# Patient Record
Sex: Female | Born: 1940 | Race: Black or African American | Hispanic: No | Marital: Married | Smoking: Never smoker
Health system: Southern US, Community
[De-identification: ages and names within clinical notes are randomized; demographics above are authoritative.]

## PROBLEM LIST (undated history)

## (undated) DIAGNOSIS — G620 Drug-induced polyneuropathy: Secondary | ICD-10-CM

## (undated) DIAGNOSIS — I509 Heart failure, unspecified: Secondary | ICD-10-CM

## (undated) DIAGNOSIS — I5022 Chronic systolic (congestive) heart failure: Secondary | ICD-10-CM

## (undated) DIAGNOSIS — C541 Malignant neoplasm of endometrium: Secondary | ICD-10-CM

## (undated) DIAGNOSIS — R627 Adult failure to thrive: Secondary | ICD-10-CM

## (undated) DIAGNOSIS — I10 Essential (primary) hypertension: Secondary | ICD-10-CM

## (undated) DIAGNOSIS — D649 Anemia, unspecified: Secondary | ICD-10-CM

## (undated) DIAGNOSIS — E119 Type 2 diabetes mellitus without complications: Secondary | ICD-10-CM

## (undated) HISTORY — PX: CHOLECYSTECTOMY OPEN: SUR202

## (undated) HISTORY — PX: MASTECTOMY: SHX3

---

## 2017-07-27 ENCOUNTER — Other Ambulatory Visit: Payer: Self-pay | Admitting: Surgery

## 2017-07-27 ENCOUNTER — Ambulatory Visit
Admission: RE | Admit: 2017-07-27 | Discharge: 2017-07-27 | Disposition: A | Discharge status: Home / Self Care | Payer: Medicare Other | Source: Ambulatory Visit | Attending: Surgery | Admitting: Surgery

## 2017-07-27 ENCOUNTER — Encounter: Payer: Medicare Other | Attending: Surgery | Admitting: Surgery

## 2017-07-27 DIAGNOSIS — Z7984 Long term (current) use of oral hypoglycemic drugs: Secondary | ICD-10-CM | POA: Diagnosis not present

## 2017-07-27 DIAGNOSIS — M8668 Other chronic osteomyelitis, other site: Secondary | ICD-10-CM | POA: Insufficient documentation

## 2017-07-27 DIAGNOSIS — E11622 Type 2 diabetes mellitus with other skin ulcer: Secondary | ICD-10-CM | POA: Insufficient documentation

## 2017-07-27 DIAGNOSIS — Z9289 Personal history of other medical treatment: Secondary | ICD-10-CM | POA: Diagnosis present

## 2017-07-27 DIAGNOSIS — L599 Disorder of the skin and subcutaneous tissue related to radiation, unspecified: Secondary | ICD-10-CM | POA: Insufficient documentation

## 2017-07-27 DIAGNOSIS — N3040 Irradiation cystitis without hematuria: Secondary | ICD-10-CM | POA: Diagnosis not present

## 2017-07-27 DIAGNOSIS — I509 Heart failure, unspecified: Secondary | ICD-10-CM | POA: Insufficient documentation

## 2017-07-27 DIAGNOSIS — J9811 Atelectasis: Secondary | ICD-10-CM | POA: Insufficient documentation

## 2017-07-27 DIAGNOSIS — Z9221 Personal history of antineoplastic chemotherapy: Secondary | ICD-10-CM | POA: Diagnosis not present

## 2017-07-27 DIAGNOSIS — K520 Gastroenteritis and colitis due to radiation: Secondary | ICD-10-CM | POA: Diagnosis not present

## 2017-07-27 DIAGNOSIS — Z86711 Personal history of pulmonary embolism: Secondary | ICD-10-CM | POA: Diagnosis not present

## 2017-07-27 DIAGNOSIS — J90 Pleural effusion, not elsewhere classified: Secondary | ICD-10-CM | POA: Insufficient documentation

## 2017-07-27 DIAGNOSIS — E1140 Type 2 diabetes mellitus with diabetic neuropathy, unspecified: Secondary | ICD-10-CM | POA: Insufficient documentation

## 2017-07-27 DIAGNOSIS — Z923 Personal history of irradiation: Secondary | ICD-10-CM | POA: Diagnosis not present

## 2017-07-27 DIAGNOSIS — I110 Hypertensive heart disease with heart failure: Secondary | ICD-10-CM | POA: Diagnosis not present

## 2017-07-27 DIAGNOSIS — C541 Malignant neoplasm of endometrium: Secondary | ICD-10-CM | POA: Diagnosis not present

## 2017-07-27 DIAGNOSIS — Z79899 Other long term (current) drug therapy: Secondary | ICD-10-CM | POA: Diagnosis not present

## 2017-07-30 NOTE — Progress Notes (Signed)
[name], [name] ([id number]) Visit Report for 07/27/2017 Allergy List Details Patient Name: Shari Turner, Shari Turner Date of Service: 07/27/2017 8:00 AM Medical Record Number: [id number] Patient Account Number: [id number] Date of Birth/Sex: 1941-03-10 (76 y.o. Female) Treating RN: Montey Hora Primary Care Arianne Klinge: Gershon Crane Other Clinician: Referring Juri Dinning: Gershon Crane Treating Inocencio Roy/Extender: Frann Rider in Treatment: 0 Allergies Active Allergies No Known Allergies Allergy Notes Electronic Signature(s) Signed: 07/27/2017 4:04:29 PM By: Montey Hora Entered By: Montey Hora on 07/27/2017 08:37:17 Shari Turner ([id number]) -------------------------------------------------------------------------------- Arrival Information Details Patient Name: Shari Turner Date of Service: 07/27/2017 8:00 AM Medical Record Number: [id number] Patient Account Number: [id number] Date of Birth/Sex: [birth year] (76 y.o. Female) Treating RN: Montey Hora Primary Care Harvel Meskill: Gershon Crane Other Clinician: Referring Maykel Reitter: Gershon Crane Treating Mariluz Crespo/Extender: Frann Rider in Treatment: 0 Visit Information Patient Arrived: Ambulatory Arrival Time: 08:32 Accompanied By: dtr Transfer Assistance: None Patient Identification Verified: Yes Secondary Verification Process Yes Completed: Patient Has Alerts: Yes Patient Alerts: Patient on Blood Thinner DMII Eliquis Electronic Signature(s) Signed: 07/27/2017 4:04:29 PM By: Montey Hora Entered By: Montey Hora on 07/27/2017 08:33:51 Shari Turner ([id number]) -------------------------------------------------------------------------------- Clinic Level of Care Assessment Details Patient Name: Shari Turner Date of Service: 07/27/2017 8:00 AM Medical Record Number: [id number] Patient Account Number: [id number] Date of Birth/Sex: 26-Mar-1941 (76 y.o. Female) Treating RN: Montey Hora Primary Care  Brittnee Gaetano: Gershon Crane Other Clinician: Referring Jupiter Boys: Gershon Crane Treating Recardo Linn/Extender: Frann Rider in Treatment: 0 Clinic Level of Care Assessment Items TOOL 2 Quantity Score []  - Use when only an EandM is performed on the INITIAL visit 0 ASSESSMENTS - Nursing Assessment / Reassessment X - General Physical Exam (combine w/ comprehensive assessment (listed just below) when 1 20 performed on new pt. evals) X- 1 25 Comprehensive Assessment (HX, ROS, Risk Assessments, Wounds Hx, etc.) ASSESSMENTS - Wound and Skin Assessment / Reassessment []  - Simple Wound Assessment / Reassessment - one wound 0 []  - 0 Complex Wound Assessment / Reassessment - multiple wounds []  - 0 Dermatologic / Skin Assessment (not related to wound area) ASSESSMENTS - Ostomy and/or Continence Assessment and Care []  - Incontinence Assessment and Management 0 []  - 0 Ostomy Care Assessment and Management (repouching, etc.) PROCESS - Coordination of Care X - Simple Patient / Family Education for ongoing care 1 15 []  - 0 Complex (extensive) Patient / Family Education for ongoing care X- 1 10 Staff obtains Programmer, systems, Records, Test Results / Process Orders []  - 0 Staff telephones HHA, Nursing Homes / Clarify orders / etc []  - 0 Routine Transfer to another Facility (non-emergent condition) []  - 0 Routine Hospital Admission (non-emergent condition) X- 1 15 New Admissions / Biomedical engineer / Ordering NPWT, Apligraf, etc. []  - 0 Emergency Hospital Admission (emergent condition) X- 1 10 Simple Discharge Coordination []  - 0 Complex (extensive) Discharge Coordination PROCESS - Special Needs []  - Pediatric / Minor Patient Management 0 []  - 0 Isolation Patient Management [name], [name] ([id number]) []  - 0 Hearing / Language / Visual special needs []  - 0 Assessment of Community assistance (transportation, D/C planning, etc.) []  - 0 Additional assistance / Altered mentation []   - 0 Support Surface(s) Assessment (bed, cushion, seat, etc.) INTERVENTIONS - Wound Cleansing / Measurement []  - Wound Imaging (photographs - any number of wounds) 0 []  - 0 Wound Tracing (instead of photographs) []  - 0 Simple Wound Measurement - one wound []  - 0 Complex Wound Measurement - multiple wounds []  - 0 Simple Wound Cleansing - one wound []  - 0 Complex Wound  understanding of the [facility] Date Initiated: 07/27/2017 Target Resolution Date: 08/17/2017 Goal Status: Active Interventions: Provide education on orientation to the wound center Notes: Electronic Signature(s) Signed: 07/27/2017 1:13:13 PM By: Montey Hora Entered By: Montey Hora on 07/27/2017 13:13:12 Shari Turner ([id number]) -------------------------------------------------------------------------------- Pain Assessment Details Patient Name: Shari Turner Date of Service: 07/27/2017 8:00 AM Medical Record Number: [id number] Patient Account Number: [id number] Date of Birth/Sex: 02-03-1941 (76 y.o. Female) Treating RN: Montey Hora Primary Care Latondra Gebhart: Gershon Crane Other Clinician: Referring Amour Trigg: Gershon Crane Treating Saud Bail/Extender: Frann Rider in Treatment: 0 Active Problems Location of Pain  Severity and Description of Pain Patient Has Paino No Site Locations Pain Management and Medication Current Pain Management: Notes Topical or injectable lidocaine is offered to patient for acute pain when surgical debridement is performed. If needed, Patient is instructed to use over the counter pain medication for the following 24-48 hours after debridement. Wound care MDs do not prescribed pain medications. Patient has chronic pain or uncontrolled pain. Patient has been instructed to make an appointment with their Primary Care Physician for pain management. Electronic Signature(s) Signed: 07/27/2017 4:04:29 PM By: Montey Hora Entered By: Montey Hora on 07/27/2017 08:34:04 Shari Turner ([id number]) -------------------------------------------------------------------------------- Patient/Caregiver Education Details Patient Name: Shari Turner Date of Service: 07/27/2017 8:00 AM Medical Record Number: [id number] Patient Account Number: [id number] Date of Birth/Gender: Mar 23, 1941 (76 y.o. Female) Treating RN: Montey Hora Primary Care Physician: Gershon Crane Other Clinician: Referring Physician: Gershon Crane Treating Physician/Extender: Frann Rider in Treatment: 0 Education Assessment Education Provided To: Patient and Caregiver Education Topics Provided Basic Hygiene: Handouts: Other: cannot wear lotion or hairspray in chambers Methods: Explain/Verbal Responses: State content correctly Electronic Signature(s) Signed: 07/27/2017 4:04:29 PM By: Montey Hora Entered By: Montey Hora on 07/27/2017 13:16:13 Shari Turner ([id number]) -------------------------------------------------------------------------------- [place] Details Patient Name: Shari Turner Date of Service: 07/27/2017 8:00 AM Medical Record Number: [id number] Patient Account Number: [id number] Date of Birth/Sex: Nov 15, 1940 (76 y.o. Female) Treating RN: Montey Hora Primary Care  Ethne Jeon: Gershon Crane Other Clinician: Referring Dub Maclellan: Gershon Crane Treating Aerilyn Slee/Extender: Frann Rider in Treatment: 0 Vital Signs Time Taken: 08:34 Temperature (F): 98.5 Height (in): 62 Pulse (bpm): 81 Source: Measured Respiratory Rate (breaths/min): 16 Weight (lbs): 148 Blood Pressure (mmHg): 142/63 Source: Measured Reference Range: 80 - 120 mg / dl Body Mass Index (BMI): 27.1 Electronic Signature(s) Signed: 07/27/2017 4:04:29 PM By: Montey Hora Entered By: Montey Hora on 07/27/2017 08:36:51  understanding of the [facility] Date Initiated: 07/27/2017 Target Resolution Date: 08/17/2017 Goal Status: Active Interventions: Provide education on orientation to the wound center Notes: Electronic Signature(s) Signed: 07/27/2017 1:13:13 PM By: Montey Hora Entered By: Montey Hora on 07/27/2017 13:13:12 Shari Turner ([id number]) -------------------------------------------------------------------------------- Pain Assessment Details Patient Name: Shari Turner Date of Service: 07/27/2017 8:00 AM Medical Record Number: [id number] Patient Account Number: [id number] Date of Birth/Sex: 02-03-1941 (76 y.o. Female) Treating RN: Montey Hora Primary Care Latondra Gebhart: Gershon Crane Other Clinician: Referring Amour Trigg: Gershon Crane Treating Saud Bail/Extender: Frann Rider in Treatment: 0 Active Problems Location of Pain  Severity and Description of Pain Patient Has Paino No Site Locations Pain Management and Medication Current Pain Management: Notes Topical or injectable lidocaine is offered to patient for acute pain when surgical debridement is performed. If needed, Patient is instructed to use over the counter pain medication for the following 24-48 hours after debridement. Wound care MDs do not prescribed pain medications. Patient has chronic pain or uncontrolled pain. Patient has been instructed to make an appointment with their Primary Care Physician for pain management. Electronic Signature(s) Signed: 07/27/2017 4:04:29 PM By: Montey Hora Entered By: Montey Hora on 07/27/2017 08:34:04 Shari Turner ([id number]) -------------------------------------------------------------------------------- Patient/Caregiver Education Details Patient Name: Shari Turner Date of Service: 07/27/2017 8:00 AM Medical Record Number: [id number] Patient Account Number: [id number] Date of Birth/Gender: Mar 23, 1941 (76 y.o. Female) Treating RN: Montey Hora Primary Care Physician: Gershon Crane Other Clinician: Referring Physician: Gershon Crane Treating Physician/Extender: Frann Rider in Treatment: 0 Education Assessment Education Provided To: Patient and Caregiver Education Topics Provided Basic Hygiene: Handouts: Other: cannot wear lotion or hairspray in chambers Methods: Explain/Verbal Responses: State content correctly Electronic Signature(s) Signed: 07/27/2017 4:04:29 PM By: Montey Hora Entered By: Montey Hora on 07/27/2017 13:16:13 Shari Turner ([id number]) -------------------------------------------------------------------------------- [place] Details Patient Name: Shari Turner Date of Service: 07/27/2017 8:00 AM Medical Record Number: [id number] Patient Account Number: [id number] Date of Birth/Sex: Nov 15, 1940 (76 y.o. Female) Treating RN: Montey Hora Primary Care  Ethne Jeon: Gershon Crane Other Clinician: Referring Dub Maclellan: Gershon Crane Treating Aerilyn Slee/Extender: Frann Rider in Treatment: 0 Vital Signs Time Taken: 08:34 Temperature (F): 98.5 Height (in): 62 Pulse (bpm): 81 Source: Measured Respiratory Rate (breaths/min): 16 Weight (lbs): 148 Blood Pressure (mmHg): 142/63 Source: Measured Reference Range: 80 - 120 mg / dl Body Mass Index (BMI): 27.1 Electronic Signature(s) Signed: 07/27/2017 4:04:29 PM By: Montey Hora Entered By: Montey Hora on 07/27/2017 08:36:51

## 2017-07-30 NOTE — Progress Notes (Addendum)
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 Turner Date of Service: 07/27/2017 Medical Record Number: [id number] Patient Account Number: [id number] Date of Birth/Sex: 01-Jan-1941 (76 y.o. 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, unspecified Facility Procedures CPT4 Code: [id number] Description: 99213 - WOUND CARE VISIT-LEV 3 EST PT Modifier: Quantity: 1 Physician Procedures CPT4 Code Description: [id number] 14431 - WC PHYS LEVEL 4 -  NEW PT ICD-10 Diagnosis Description 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

## 2017-07-30 NOTE — Progress Notes (Signed)
Shari Turner, Shari Turner ([id number]) Visit Report for 07/27/2017 Abuse/Suicide Risk Screen Details Patient Name: Shari Turner Date of Service: 07/27/2017 8:00 AM Medical Record Number: [id number] Patient Account Number: [id number] Date of Birth/Sex: 17-Jul-1941 (76 y.o. Female) Treating RN: Curtis Sites Primary Care Rozena Fierro: [name] Other Clinician: Referring Jensen Kilburg: [name] Treating Rexine Gowens/Extender: Rudene Re in Treatment: 0 Abuse/Suicide Risk Screen Items Answer ABUSE/SUICIDE RISK SCREEN: Has anyone close to you tried to hurt or harm you recentlyo No Do you feel uncomfortable with anyone in your familyo No Has anyone forced you do things that you didnot want to doo No Do you have any thoughts of harming yourselfo No Patient displays signs or symptoms of abuse and/or neglect. No Electronic Signature(s) Signed: 07/27/2017 4:04:29 PM By: Curtis Sites Entered By: Curtis Sites on 07/27/2017 08:37:26 Shari Turner ([id number]) -------------------------------------------------------------------------------- Activities of Daily Living Details Patient Name: Shari Turner Date of Service: 07/27/2017 8:00 AM Medical Record Number: [id number] Patient Account Number: [id number] Date of Birth/Sex: May 01, 1941 (76 y.o. Female) Treating RN: Curtis Sites Primary Care Javell Blackburn: [name] Other Clinician: Referring Trejon Duford: [name] Treating Jacquelinne Speak/Extender: Rudene Re in Treatment: 0 Activities of Daily Living Items Answer Activities of Daily Living (Please select one for each item) Drive Automobile Completely Able Take Medications Completely Able Use Telephone Completely Able Care for Appearance Completely Able Use Toilet Completely Able Bath / Shower Completely Able Dress Self Completely Able Feed Self Completely Able Walk Completely Able Get In / Out Bed Completely Able Housework Completely Able Prepare Meals Completely  Able Handle Money Completely Able Shop for Self Completely Able Electronic Signature(s) Signed: 07/27/2017 4:04:29 PM By: Curtis Sites Entered By: Curtis Sites on 07/27/2017 08:37:45 Shari Turner ([id number]) -------------------------------------------------------------------------------- Education Assessment Details Patient Name: Shari Turner Date of Service: 07/27/2017 8:00 AM Medical Record Number: [id number] Patient Account Number: [id number] Date of Birth/Sex: Jul 11, 1941 (76 y.o. Female) Treating RN: Curtis Sites Primary Care Alyne Martinson: [name] Other Clinician: Referring Carlia Bomkamp: [name] Treating Enolia Koepke/Extender: Rudene Re in Treatment: 0 Primary Learner Assessed: Patient Learning Preferences/Education Level/Primary Language Learning Preference: Explanation, Demonstration Highest Education Level: High School Cognitive Barrier Assessment/Beliefs Language Barrier: No Translator Needed: No Memory Deficit: No Emotional Barrier: No Cultural/Religious Beliefs Affecting Medical Care: No Physical Barrier Assessment Impaired Vision: No Impaired Hearing: No Decreased Hand dexterity: No Knowledge/Comprehension Assessment Knowledge Level: Medium Comprehension Level: Medium Ability to understand written Medium instructions: Ability to understand verbal Medium instructions: Motivation Assessment Anxiety Level: Calm Cooperation: Cooperative Education Importance: Acknowledges Need Interest in Health Problems: Asks Questions Perception: Coherent Willingness to Engage in Self- Medium Management Activities: Readiness to Engage in Self- Medium Management Activities: Electronic Signature(s) Signed: 07/27/2017 4:04:29 PM By: Curtis Sites Entered By: Curtis Sites on 07/27/2017 08:38:06 Shari Turner ([id number]) -------------------------------------------------------------------------------- Fall Risk Assessment Details Patient  Name: Shari Turner Date of Service: 07/27/2017 8:00 AM Medical Record Number: [id number] Patient Account Number: [id number] Date of Birth/Sex: [birth year] (76 y.o. Female) Treating RN: Curtis Sites Primary Care Mylan Lengyel: [name] Other Clinician: Referring Alek Poncedeleon: [name] Treating Ruhee Enck/Extender: Rudene Re in Treatment: 0 Fall Risk Assessment Items Have you had 2 or more falls in the last 12 monthso 0 No Have you had any fall that resulted in injury in the last 12 monthso 0 No FALL RISK ASSESSMENT: History of falling - immediate or within 3 months 0 No Secondary diagnosis 0 No Ambulatory aid None/bed rest/wheelchair/nurse 0 Yes Crutches/cane/walker 0 No Furniture 0 No IV Access/Saline Lock 0 No Gait/Training Normal/bed rest/immobile 0 No Weak 10 Yes Impaired  0 No Mental Status Oriented to own ability 0 Yes Electronic Signature(s) Signed: 07/27/2017 4:04:29 PM By: Curtis Sites Entered By: Curtis Sites on 07/27/2017 08:38:26 Shari Turner ([id number]) -------------------------------------------------------------------------------- Nutrition Risk Assessment Details Patient Name: Shari Turner Date of Service: 07/27/2017 8:00 AM Medical Record Number: [id number] Patient Account Number: [id number] Date of Birth/Sex: 05-01-1941 (76 y.o. Female) Treating RN: Curtis Sites Primary Care Dannie Hattabaugh: [name] Other Clinician: Referring Lelia Jons: [name] Treating Shelitha Magley/Extender: Rudene Re in Treatment: 0 Height (in): 62 Weight (lbs): 148 Body Mass Index (BMI): 27.1 Nutrition Risk Assessment Items NUTRITION RISK SCREEN: I have an illness or condition that made me change the kind and/or amount of 0 No food I eat I eat fewer than two meals per day 0 No I eat few fruits and vegetables, or milk products 0 No I have three or more drinks of beer, liquor or wine almost every day 0 No I have tooth or mouth problems  that make it hard for me to eat 0 No I don't always have enough money to buy the food I need 0 No I eat alone most of the time 0 No I take three or more different prescribed or over-the-counter drugs a day 1 Yes Without wanting to, I have lost or gained 10 pounds in the last six months 0 No I am not always physically able to shop, cook and/or feed myself 0 No Nutrition Protocols Good Risk Protocol 0 No interventions needed Moderate Risk Protocol Electronic Signature(s) Signed: 07/27/2017 4:04:29 PM By: Curtis Sites Entered By: Curtis Sites on 07/27/2017 08:38:32

## 2017-08-06 ENCOUNTER — Encounter: Payer: Medicare Other | Admitting: Surgery

## 2017-08-06 DIAGNOSIS — E11622 Type 2 diabetes mellitus with other skin ulcer: Secondary | ICD-10-CM | POA: Diagnosis not present

## 2017-08-06 NOTE — Progress Notes (Addendum)
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, other site L59.9  Disorder of the skin and subcutaneous tissue related to radiation, unspecified Facility Procedures CPT4 Code: [id number] Description: [phone] - WOUND CARE VISIT-LEV 2 EST PT Modifier: Quantity: 1 Physician Procedures CPT4 Code: [id number] Description: [phone] - WC PHYS  LEVEL 2 - EST PT ICD-10 Diagnosis Description 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 M86.68 Other chronic  osteomyelitis, other site Modifier: Quantity: 1 Electronic Signature(s) Signed: 08/24/2017 10:18:15 AM By: Christin Fudge MD, FACS Previous Signature: 08/24/2017 8:13:40 AM Version By: Christin Fudge MD, FACS Previous Signature: 08/22/2017 1:09:44 PM Version By: Christin Fudge MD, FACS Previous Signature: 08/16/2017 10:55:35 AM Version By: Christin Fudge MD, FACS Previous Signature: 08/06/2017 12:36:11 PM Version By: Montey Hora Previous Signature: 08/06/2017 4:25:36 PM Version By: Christin Fudge MD, FACS Previous Signature: 08/06/2017 10:26:44 AM Version By: Christin Fudge MD, FACS Entered By: Christin Fudge on 08/24/2017 10:18:15

## 2017-08-07 NOTE — Progress Notes (Signed)
wound []  - 0 Complex Wound Measurement - multiple wounds INTERVENTIONS - Wound Dressings []  - Small Wound Dressing one or multiple wounds 0 []  - 0 Medium Wound Dressing one or multiple wounds []  - 0 Large Wound Dressing one or multiple wounds []  - 0 Application of Medications - topical []  - 0 Application of Medications - injection INTERVENTIONS - Miscellaneous []  - External ear exam 0 []  - 0 Specimen Collection (cultures, biopsies, blood, body fluids, etc.) []  - 0 Specimen(s) / Culture(s) sent or taken to Lab for analysis []  - 0 Patient Transfer (multiple staff / Civil Service fast streamer / Similar devices) []  - 0 Simple Staple / Suture removal (25 or less) []  - 0 Complex Staple / Suture removal (26 or more) []  - 0 Hypo / Hyperglycemic Management (close monitor of Blood Glucose) []  - 0 Ankle / Brachial Index (ABI) - do not check if billed separately X- 1 5 Vital Signs Casteneda, Sherryn ([id number]) Has the patient been seen at the hospital within the last three years: Yes Total Score: 45 Level Of Care: New/Established - Level 2 Electronic Signature(s) Signed: 08/06/2017 6:04:08 PM By: Montey Hora Entered By: Montey Hora on 08/06/2017 12:36:03 Shari Turner ([id number]) -------------------------------------------------------------------------------- Encounter Discharge Information Details Patient Name: Shari Turner Date of Service: 08/06/2017 9:15 AM Medical Record Number: [id number] Patient Account Number: [id number] Date of Birth/Sex: [birth year] (76 y.o. Female) Treating RN: Montey Hora Primary Care Lareta Bruneau: Gershon Crane Other Clinician: Referring Nonnie Pickney: Gershon Crane Treating Raynesha Tiedt/Extender: Frann Rider in Treatment: 1 Encounter Discharge  Information Items Discharge Pain Level: 0 Discharge Condition: Stable Ambulatory Status: Wheelchair Discharge Destination: Home Private Transportation: Auto Accompanied By: dtr Schedule Follow-up Appointment: Yes Medication Reconciliation completed and provided No to Patient/Care Steffani Dionisio: Clinical Summary of Care: Electronic Signature(s) Signed: 08/06/2017 12:36:44 PM By: Montey Hora Entered By: Montey Hora on 08/06/2017 12:36:44 Shari Turner ([id number]) -------------------------------------------------------------------------------- Multi Wound Chart Details Patient Name: Shari Turner Date of Service: 08/06/2017 9:15 AM Medical Record Number: [id number] Patient Account Number: [id number] Date of Birth/Sex: [birth year] (76 y.o. Female) Treating RN: Montey Hora Primary Care Elizabet Schweppe: Gershon Crane Other Clinician: Referring Amaliya Whitelaw: Gershon Crane Treating Philisha Weinel/Extender: Frann Rider in Treatment: 1 Wound Assessments Treatment Notes Electronic Signature(s) Signed: 08/06/2017 10:23:04 AM By: Christin Fudge MD, FACS Entered By: Christin Fudge on 08/06/2017 10:23:04 Shari Turner ([id number]) -------------------------------------------------------------------------------- [facility] Details Patient Name: Shari Turner Date of Service: 08/06/2017 9:15 AM Medical Record Number: [id number] Patient Account Number: [id number] Date of Birth/Sex: August 03, 1941 (76 y.o. Female) Treating RN: Montey Hora Primary Care Amaan Meyer: Gershon Crane Other Clinician: Referring Rylan Kaufmann: Gershon Crane Treating Yaqub Arney/Extender: Frann Rider in Treatment: 1 Active Inactive ` HBO Nursing Diagnoses: Potential for barotraumas to ears, sinuses, teeth, and lungs or cerebral gas embolism related to changes in atmospheric pressure inside hyperbaric oxygen chamber Potential for pulmonary oxygen toxicity related to delivery of 100% oxygen  at an increased atmospheric pressure Goals: Patient will tolerate the hyperbaric oxygen therapy treatment Date Initiated: 07/27/2017 Target Resolution Date: 10/12/2017 Goal Status: Active Interventions: Administer the correct therapeutic gas delivery based on the patients needs and limitations, per physician order Assess and provide for patientos comfort related to the hyperbaric environment and equalization of middle ear Assess for signs and symptoms related to adverse events, including but not limited to confinement anxiety, pneumothorax, oxygen toxicity and baurotrauma Notes: ` Orientation to the Wound Care Program Nursing Diagnoses: Knowledge deficit related to the wound healing center program Goals: Patient/caregiver will verbalize understanding of the [facility] Date  wound []  - 0 Complex Wound Measurement - multiple wounds INTERVENTIONS - Wound Dressings []  - Small Wound Dressing one or multiple wounds 0 []  - 0 Medium Wound Dressing one or multiple wounds []  - 0 Large Wound Dressing one or multiple wounds []  - 0 Application of Medications - topical []  - 0 Application of Medications - injection INTERVENTIONS - Miscellaneous []  - External ear exam 0 []  - 0 Specimen Collection (cultures, biopsies, blood, body fluids, etc.) []  - 0 Specimen(s) / Culture(s) sent or taken to Lab for analysis []  - 0 Patient Transfer (multiple staff / Civil Service fast streamer / Similar devices) []  - 0 Simple Staple / Suture removal (25 or less) []  - 0 Complex Staple / Suture removal (26 or more) []  - 0 Hypo / Hyperglycemic Management (close monitor of Blood Glucose) []  - 0 Ankle / Brachial Index (ABI) - do not check if billed separately X- 1 5 Vital Signs Casteneda, Sherryn ([id number]) Has the patient been seen at the hospital within the last three years: Yes Total Score: 45 Level Of Care: New/Established - Level 2 Electronic Signature(s) Signed: 08/06/2017 6:04:08 PM By: Montey Hora Entered By: Montey Hora on 08/06/2017 12:36:03 Shari Turner ([id number]) -------------------------------------------------------------------------------- Encounter Discharge Information Details Patient Name: Shari Turner Date of Service: 08/06/2017 9:15 AM Medical Record Number: [id number] Patient Account Number: [id number] Date of Birth/Sex: [birth year] (76 y.o. Female) Treating RN: Montey Hora Primary Care Lareta Bruneau: Gershon Crane Other Clinician: Referring Nonnie Pickney: Gershon Crane Treating Raynesha Tiedt/Extender: Frann Rider in Treatment: 1 Encounter Discharge  Information Items Discharge Pain Level: 0 Discharge Condition: Stable Ambulatory Status: Wheelchair Discharge Destination: Home Private Transportation: Auto Accompanied By: dtr Schedule Follow-up Appointment: Yes Medication Reconciliation completed and provided No to Patient/Care Steffani Dionisio: Clinical Summary of Care: Electronic Signature(s) Signed: 08/06/2017 12:36:44 PM By: Montey Hora Entered By: Montey Hora on 08/06/2017 12:36:44 Shari Turner ([id number]) -------------------------------------------------------------------------------- Multi Wound Chart Details Patient Name: Shari Turner Date of Service: 08/06/2017 9:15 AM Medical Record Number: [id number] Patient Account Number: [id number] Date of Birth/Sex: [birth year] (76 y.o. Female) Treating RN: Montey Hora Primary Care Elizabet Schweppe: Gershon Crane Other Clinician: Referring Amaliya Whitelaw: Gershon Crane Treating Philisha Weinel/Extender: Frann Rider in Treatment: 1 Wound Assessments Treatment Notes Electronic Signature(s) Signed: 08/06/2017 10:23:04 AM By: Christin Fudge MD, FACS Entered By: Christin Fudge on 08/06/2017 10:23:04 Shari Turner ([id number]) -------------------------------------------------------------------------------- [facility] Details Patient Name: Shari Turner Date of Service: 08/06/2017 9:15 AM Medical Record Number: [id number] Patient Account Number: [id number] Date of Birth/Sex: August 03, 1941 (76 y.o. Female) Treating RN: Montey Hora Primary Care Amaan Meyer: Gershon Crane Other Clinician: Referring Rylan Kaufmann: Gershon Crane Treating Yaqub Arney/Extender: Frann Rider in Treatment: 1 Active Inactive ` HBO Nursing Diagnoses: Potential for barotraumas to ears, sinuses, teeth, and lungs or cerebral gas embolism related to changes in atmospheric pressure inside hyperbaric oxygen chamber Potential for pulmonary oxygen toxicity related to delivery of 100% oxygen  at an increased atmospheric pressure Goals: Patient will tolerate the hyperbaric oxygen therapy treatment Date Initiated: 07/27/2017 Target Resolution Date: 10/12/2017 Goal Status: Active Interventions: Administer the correct therapeutic gas delivery based on the patients needs and limitations, per physician order Assess and provide for patientos comfort related to the hyperbaric environment and equalization of middle ear Assess for signs and symptoms related to adverse events, including but not limited to confinement anxiety, pneumothorax, oxygen toxicity and baurotrauma Notes: ` Orientation to the Wound Care Program Nursing Diagnoses: Knowledge deficit related to the wound healing center program Goals: Patient/caregiver will verbalize understanding of the [facility] Program Date  SUEELLEN, KAYES ([id number]) Visit Report for 08/06/2017 Arrival Information Details Patient Name: LANEE, CHAIN Date of Service: 08/06/2017 9:15 AM Medical Record Number: [id number] Patient Account Number: [id number] Date of Birth/Sex: 11-Aug-1941 (76 y.o. Female) Treating RN: Montey Hora Primary Care Sharrell Krawiec: Gershon Crane Other Clinician: Referring Jerame Hedding: Gershon Crane Treating Rayen Palen/Extender: Frann Rider in Treatment: 1 Visit Information History Since Last Visit Added or deleted any medications: No Patient Arrived: Wheel Chair Any new allergies or adverse reactions: No Arrival Time: 09:56 Had a fall or experienced change in No Accompanied By: dtr activities of daily living that may affect Transfer Assistance: None risk of falls: Patient Identification Verified: Yes Signs or symptoms of abuse/neglect since last visito No Secondary Verification Process Yes Hospitalized since last visit: No Completed: Pain Present Now: No Patient Has Alerts: Yes Patient Alerts: Patient on Blood Thinner DMII Eliquis Electronic Signature(s) Signed: 08/06/2017 6:04:08 PM By: Montey Hora Entered By: Montey Hora on 08/06/2017 09:57:11 Shari Turner ([id number]) -------------------------------------------------------------------------------- Clinic Level of Care Assessment Details Patient Name: Shari Turner Date of Service: 08/06/2017 9:15 AM Medical Record Number: [id number] Patient Account Number: [id number] Date of Birth/Sex: 09-Sep-1941 (76 y.o. Female) Treating RN: Montey Hora Primary Care Alice Vitelli: Gershon Crane Other Clinician: Referring Yoseph Haile: Gershon Crane Treating Akya Fiorello/Extender: Frann Rider in Treatment: 1 Clinic Level of Care Assessment Items TOOL 4 Quantity Score []  - Use when only an EandM is performed on FOLLOW-UP visit 0 ASSESSMENTS - Nursing Assessment / Reassessment X - Reassessment of Co-morbidities (includes  updates in patient status) 1 10 X- 1 5 Reassessment of Adherence to Treatment Plan ASSESSMENTS - Wound and Skin Assessment / Reassessment []  - Simple Wound Assessment / Reassessment - one wound 0 []  - 0 Complex Wound Assessment / Reassessment - multiple wounds []  - 0 Dermatologic / Skin Assessment (not related to wound area) ASSESSMENTS - Focused Assessment []  - Circumferential Edema Measurements - multi extremities 0 []  - 0 Nutritional Assessment / Counseling / Intervention []  - 0 Lower Extremity Assessment (monofilament, tuning fork, pulses) []  - 0 Peripheral Arterial Disease Assessment (using hand held doppler) ASSESSMENTS - Ostomy and/or Continence Assessment and Care []  - Incontinence Assessment and Management 0 []  - 0 Ostomy Care Assessment and Management (repouching, etc.) PROCESS - Coordination of Care X - Simple Patient / Family Education for ongoing care 1 15 []  - 0 Complex (extensive) Patient / Family Education for ongoing care []  - 0 Staff obtains Programmer, systems, Records, Test Results / Process Orders []  - 0 Staff telephones HHA, Nursing Homes / Clarify orders / etc []  - 0 Routine Transfer to another Facility (non-emergent condition) []  - 0 Routine Hospital Admission (non-emergent condition) []  - 0 New Admissions / Biomedical engineer / Ordering NPWT, Apligraf, etc. []  - 0 Emergency Hospital Admission (emergent condition) X- 1 10 Simple Discharge Coordination Guin, Lenix ([id number]) []  - 0 Complex (extensive) Discharge Coordination PROCESS - Special Needs []  - Pediatric / Minor Patient Management 0 []  - 0 Isolation Patient Management []  - 0 Hearing / Language / Visual special needs []  - 0 Assessment of Community assistance (transportation, D/C planning, etc.) []  - 0 Additional assistance / Altered mentation []  - 0 Support Surface(s) Assessment (bed, cushion, seat, etc.) INTERVENTIONS - Wound Cleansing / Measurement []  - Simple Wound Cleansing - one  wound 0 []  - 0 Complex Wound Cleansing - multiple wounds []  - 0 Wound Imaging (photographs - any number of wounds) []  - 0 Wound Tracing (instead of photographs) []  - 0 Simple Wound Measurement - one

## 2017-08-07 NOTE — Progress Notes (Signed)
MUNIRAH, DOERNER ([id number]) Visit Report for 08/06/2017 HBO Risk Assessment Details Patient Name: Shari Turner, Shari Turner Date of Service: 08/06/2017 9:15 AM Medical Record Number: [id number] Patient Account Number: [id number] Date of Birth/Sex: [birth year] (76 y.o. Female) Treating RN: Montey Hora Primary Care Jari Carollo: Gershon Crane Other Clinician: Referring Leviticus Harton: Gershon Crane Treating Riyana Biel/Extender: Frann Rider in Treatment: 1 HBO Risk Assessment Items Answer Barotrauma Risks: Upper Respiratory Infections No Prior Radiation Treatment to Head/Neck No Tracheostomy No Ear problems or surgery (otosclerosis)- Consider pressure equalization tubes No Sinus Problems, Sinus Obstruction No Pulmonary Risks: Currently seeing a pulmonologisto No Emphysema No Pneumothorax No Tuberculosis No Other lung problems (COPD with CO2 retention, lesions, surgery) -Refer to No CPGs Congestive heart Failure -Consider holding HBO if ejection fraction<30% Yes History of smoking No Bullous Disease, Blebs No Other pulmonary abnormalities No Cardiac Risks: Currently seeing a cardiologisto Yes Dr Nyra Capes Pacemaker/AICD No Hypertension No furosemide Diuretic Used (water pill). If yes, last time taken: Yes PRN History of prior or current malignancy (Cancer) Surgery Yes Radiation therapy Yes If Yes for Radiation Therapy, number of treatments received: 25 Chemotherapy Yes Ophthalmic Risks: Optic Neuritis No Cataracts Yes Myopia No Retinopathy or Retinal Detachment Surgery- Consider pressure equalization No tubes Paull, Baylie ([id number]) Confinement Anxiety Claustrophobia No Dialysis Dialysis No Any implants; medical or non-medical No Pregnancy No Diabetes Non-Insulin if Yes for Diabetes: Dependent Diabetes Medication: metformin, glimepiride Seizures Seizures No Currently using these medications: Aspirin No Digoxin (CHF patient) No Narcotics No Nitroprusside  No Phenothiazine (Thorazine,etc.) No Prednisone or other steroids No Disulfiram (Antabuse) No Mafenide Acetate (Sulfamylon-burn cream) No Amiodarone No Date of last Chest X-ray: 07/27/2017 Notes cataracts removed Electronic Signature(s) Signed: 08/06/2017 6:04:08 PM By: Montey Hora Entered By: Montey Hora on 08/06/2017 10:03:57

## 2017-09-03 ENCOUNTER — Encounter: Payer: Medicare Other | Attending: Surgery | Admitting: Surgery

## 2017-09-03 DIAGNOSIS — Z86711 Personal history of pulmonary embolism: Secondary | ICD-10-CM | POA: Insufficient documentation

## 2017-09-03 DIAGNOSIS — M8668 Other chronic osteomyelitis, other site: Secondary | ICD-10-CM | POA: Insufficient documentation

## 2017-09-03 DIAGNOSIS — E11622 Type 2 diabetes mellitus with other skin ulcer: Secondary | ICD-10-CM | POA: Diagnosis present

## 2017-09-03 DIAGNOSIS — L599 Disorder of the skin and subcutaneous tissue related to radiation, unspecified: Secondary | ICD-10-CM | POA: Diagnosis not present

## 2017-09-03 DIAGNOSIS — Z79899 Other long term (current) drug therapy: Secondary | ICD-10-CM | POA: Insufficient documentation

## 2017-09-03 DIAGNOSIS — N3040 Irradiation cystitis without hematuria: Secondary | ICD-10-CM | POA: Insufficient documentation

## 2017-09-03 DIAGNOSIS — C541 Malignant neoplasm of endometrium: Secondary | ICD-10-CM | POA: Insufficient documentation

## 2017-09-03 DIAGNOSIS — I509 Heart failure, unspecified: Secondary | ICD-10-CM | POA: Insufficient documentation

## 2017-09-03 DIAGNOSIS — E1140 Type 2 diabetes mellitus with diabetic neuropathy, unspecified: Secondary | ICD-10-CM | POA: Insufficient documentation

## 2017-09-03 DIAGNOSIS — K520 Gastroenteritis and colitis due to radiation: Secondary | ICD-10-CM | POA: Insufficient documentation

## 2017-09-03 DIAGNOSIS — I110 Hypertensive heart disease with heart failure: Secondary | ICD-10-CM | POA: Diagnosis not present

## 2017-09-03 DIAGNOSIS — Z923 Personal history of irradiation: Secondary | ICD-10-CM | POA: Insufficient documentation

## 2017-09-03 DIAGNOSIS — Z9221 Personal history of antineoplastic chemotherapy: Secondary | ICD-10-CM | POA: Insufficient documentation

## 2017-09-03 DIAGNOSIS — Z7984 Long term (current) use of oral hypoglycemic drugs: Secondary | ICD-10-CM | POA: Diagnosis not present

## 2017-09-03 LAB — GLUCOSE, CAPILLARY
Glucose-Capillary: 468 mg/dL — ABNORMAL HIGH (ref 65–99)
Glucose-Capillary: 381 mg/dL — ABNORMAL HIGH (ref 65–99)

## 2017-09-03 NOTE — Progress Notes (Signed)
Shari, Turner ([id number]) Visit Report for 09/03/2017 HBO Details Patient Name: Shari Turner, Shari Turner Date of Service: 09/03/2017 10:00 AM Medical Record Number: [id number] Patient Account Number: [id number] Date of Birth/Sex: 13-Oct-1940 (76 y.o. 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 HBO Treatment Details Treatment Number: 1 Patient Type: Outpatient Chamber Type: Monoplace Chamber Serial #: [id number] Treatment Protocol: 2.0 ATA with 90 minutes oxygen, and no air breaks Treatment Details Compression Rate Down: 1.5 psi / minute De-Compression Rate Up: 1.5 psi / minute Compress Tx Pressure Air breaks and breathing periods Decompress Decompress Begins Reached (leave unused spaces blank) Begins Ends Chamber Pressure (ATA) 1 2 - - - - - - 2 1 Clock Time (24 hr) 10:08 10:18 - - - - - - 11:48 11:58 Treatment Length: 110 (minutes) Treatment Segments: 4 Capillary Blood Glucose Pre Capillary Blood Glucose (mg/dl): Post Capillary Blood Glucose (mg/dl): Vital Signs Capillary Blood Glucose Reference Range: 80 - 120 mg / dl HBO Diabetic Blood Glucose Intervention Range: <131 mg/dl or >295 mg/dl Time Vitals Blood Respiratory Capillary Blood Glucose Pulse Action Type: Pulse: Temperature: Taken: Pressure: Rate: Glucose (mg/dl): Meter #: Oximetry (%) Taken: Pre 09:36 122/60 66 16 98.5 468 1 none per protocol; MD notified Post 12:06 124/70 72 16 98 381 1 none per protocol; MD made aware Treatment Response Treatment Completion Status: Treatment Completed without Adverse Event Electronic Signature(s) Signed: 09/03/2017 12:51:27 PM  By: Dayton Martes RCP, RRT, CHT Signed: 09/03/2017 1:01:45 PM By: [name] MD, FACS Previous Signature: 09/03/2017 12:09:23 PM Version By: [name] MD, FACS Entered By: Dayton Martes on 09/03/2017 12:49:12 Shari Turner ([id number]) -------------------------------------------------------------------------------- HBO Safety Checklist Details Patient Name: Shari Turner Date of Service: 09/03/2017 10:00 AM Medical Record Number: [id number] Patient Account Number: [id number] Date of Birth/Sex: 10/13/1940 (76 y.o. Female) Treating RN: Primary Care Alexanderjames Berg: [name] Other Clinician: [name] Referring Elanda Garmany: [name] Treating Lamyiah Crawshaw/Extender: Rudene Re in Treatment: 5 HBO Safety Checklist Items Safety Checklist Consent Form Signed Patient voided / foley secured and emptied When did you last eato 09/03/17 am Last dose of injectable or oral agent 09/03/17 am Ostomy pouch emptied and vented if applicable NA All implantable devices assessed, documented and approved NA Intravenous access site secured and place NA Valuables secured Linens and cotton and cotton/polyester blend (less than 51% polyester) Personal oil-based products / skin lotions / body lotions removed Wigs or hairpieces removed NA Smoking or tobacco materials removed NA Books / newspapers / magazines / loose paper removed Cologne, aftershave, perfume and deodorant removed Jewelry removed (may wrap wedding band) Make-up removed Hair care products removed Battery operated devices (external) removed Heating patches and chemical warmers removed Titanium eyewear removed NA Nail polish cured greater than 10 hours Casting material cured greater than 10 hours NA Hearing aids removed NA Loose dentures or partials removed NA Prosthetics have been removed NA Patient demonstrates correct use of air break device (if applicable) Patient  concerns have been addressed Patient grounding bracelet on and cord attached to chamber Specifics for Inpatients (complete in addition to above) Medication sheet sent with patient Intravenous medications needed or due during therapy sent with patient Drainage tubes (e.g. nasogastric tube or chest tube secured and vented) Endotracheal or Tracheotomy tube secured Cuff  deflated of air and inflated with saline Airway suctioned Electronic Signature(s) Signed: 09/03/2017 12:51:27 PM By: Dayton Martes RCP, RRT, CHT [place], [name] ([id number]) Entered By: Dayton Martes on 09/03/2017 10:15:17

## 2017-09-03 NOTE — Progress Notes (Signed)
TAKYAH, CIARAMITARO ([id number]) Visit Report for 09/03/2017 Arrival Information Details Patient Name: Shari Turner, Shari Turner Date of Service: 09/03/2017 10:00 AM Medical Record Number: [id number] Patient Account Number: [id number] Date of Birth/Sex: [birth year] (76 y.o. Female) Treating RN: Primary Care Yigit Norkus: Gershon Crane Other Clinician: Jacqulyn Bath Referring Macen Joslin: Gershon Crane Treating Lizann Edelman/Extender: Frann Rider in Treatment: 5 Visit Information History Since Last Visit Added or deleted any medications: No Patient Arrived: Wheel Chair Any new allergies or adverse reactions: No Arrival Time: 09:05 Had a fall or experienced change in No Accompanied By: daughter Georgina Peer activities of daily living that may affect Transfer Assistance: Manual risk of falls: Patient Identification Verified: Yes Signs or symptoms of abuse/neglect since last visito No Secondary Verification Process Yes Hospitalized since last visit: No Completed: Pain Present Now: No Patient Has Alerts: Yes Patient Alerts: Patient on Blood Thinner DMII Eliquis Electronic Signature(s) Signed: 09/03/2017 12:51:27 PM By: Lorine Bears RCP, RRT, CHT Entered By: Lorine Bears on 09/03/2017 10:11:02 Shari Turner ([id number]) -------------------------------------------------------------------------------- Encounter Discharge Information Details Patient Name: Shari Turner Date of Service: 09/03/2017 10:00 AM Medical Record Number: [id number] Patient Account Number: [id number] Date of Birth/Sex: 1941/01/09 (76 y.o. Female) Treating RN: Primary Care Inman Fettig: Gershon Crane Other Clinician: Jacqulyn Bath Referring Dajour Pierpoint: Gershon Crane Treating Cathi Hazan/Extender: Frann Rider in Treatment: 5 Encounter Discharge Information Items Discharge Pain Level: 0 Discharge Condition: Stable Ambulatory Status: Wheelchair Discharge Destination:  Home Transportation: Private Auto daughter, Accompanied By: Georgina Peer Schedule Follow-up Appointment: No Medication Reconciliation completed and No provided to Patient/Care Malkie Wille: Clinical Summary of Care: Notes Patient has an HBO treatment scheduled on 09/04/17 at 10:00 am. Electronic Signature(s) Signed: 09/03/2017 12:51:27 PM By: Lorine Bears RCP, RRT, CHT Entered By: Lorine Bears on 09/03/2017 12:50:31 Shari Turner ([id number]) -------------------------------------------------------------------------------- Vitals Details Patient Name: Shari Turner Date of Service: 09/03/2017 10:00 AM Medical Record Number: [id number] Patient Account Number: [id number] Date of Birth/Sex: [birth year] (76 y.o. Female) Treating RN: Primary Care Maleny Candy: Gershon Crane Other Clinician: Jacqulyn Bath Referring Lakeesha Fontanilla: Gershon Crane Treating Ruhee Enck/Extender: Frann Rider in Treatment: 5 Vital Signs Time Taken: 09:36 Temperature (F): 98.5 Height (in): 62 Pulse (bpm): 66 Weight (lbs): 148 Respiratory Rate (breaths/min): 16 Body Mass Index (BMI): 27.1 Blood Pressure (mmHg): 122/60 Capillary Blood Glucose (mg/dl): 468 Reference Range: 80 - 120 mg / dl Electronic Signature(s) Signed: 09/03/2017 12:51:27 PM By: Lorine Bears RCP, RRT, CHT Entered By: Lorine Bears on 09/03/2017 10:13:17

## 2017-09-04 ENCOUNTER — Encounter: Payer: Medicare Other | Admitting: Internal Medicine

## 2017-09-04 DIAGNOSIS — E11622 Type 2 diabetes mellitus with other skin ulcer: Secondary | ICD-10-CM | POA: Diagnosis not present

## 2017-09-04 LAB — GLUCOSE, CAPILLARY
GLUCOSE-CAPILLARY: 318 mg/dL — AB (ref 65–99)
Glucose-Capillary: 296 mg/dL — ABNORMAL HIGH (ref 65–99)

## 2017-09-04 NOTE — Progress Notes (Signed)
Shari Turner, Shari Turner ([id number]) Visit Report for 09/04/2017 Arrival Information Details Patient Name: Shari Turner, Shari Turner Date of Service: 09/04/2017 10:00 AM Medical Record Number: [id number] Patient Account Number: [id number] Date of Birth/Sex: 04-15-1941 (76 y.o. Female) Treating RN: Primary Care Calypso Hagarty: Gershon Crane Other Clinician: Jacqulyn Bath Referring Kennley Schwandt: Gershon Crane Treating Shandie Bertz/Extender: Tito Dine in Treatment: 5 Visit Information History Since Last Visit Added or deleted any medications: No Patient Arrived: Wheel Chair Any new allergies or adverse reactions: No Arrival Time: 09:05 Had a fall or experienced change in No Accompanied By: daughter, Shari Turner activities of daily living that may affect Transfer Assistance: Manual risk of falls: Patient Identification Verified: Yes Signs or symptoms of abuse/neglect since last visito No Secondary Verification Process Yes Hospitalized since last visit: No Completed: Pain Present Now: Yes Patient Has Alerts: Yes Patient Alerts: Patient on Blood Thinner DMII Eliquis Electronic Signature(s) Signed: 09/04/2017 12:04:34 PM By: Lorine Bears RCP, RRT, CHT Entered By: Lorine Bears on 09/04/2017 09:57:02 Shari Turner ([id number]) -------------------------------------------------------------------------------- Encounter Discharge Information Details Patient Name: Shari Turner Date of Service: 09/04/2017 10:00 AM Medical Record Number: [id number] Patient Account Number: [id number] Date of Birth/Sex: 1941/05/25 (76 y.o. Female) Treating RN: Primary Care Joei Frangos: Gershon Crane Other Clinician: Jacqulyn Bath Referring Sonny Poth: Gershon Crane Treating Venna Berberich/Extender: Tito Dine in Treatment: 5 Encounter Discharge Information Items Discharge Pain Level: 0 Discharge Condition: Stable Ambulatory Status: Wheelchair Discharge Destination:  Home Transportation: Private Auto daughter, Accompanied By: Shari Turner Schedule Follow-up Appointment: No Medication Reconciliation completed and No provided to Patient/Care Kebra Lowrimore: Clinical Summary of Care: Notes Patient has an HBO treatment scheduled on 09/05/17 at 10:00 am. Electronic Signature(s) Signed: 09/04/2017 12:04:34 PM By: Lorine Bears RCP, RRT, CHT Entered By: Lorine Bears on 09/04/2017 12:04:16 Shari Turner ([id number]) -------------------------------------------------------------------------------- Vitals Details Patient Name: Shari Turner Date of Service: 09/04/2017 10:00 AM Medical Record Number: [id number] Patient Account Number: [id number] Date of Birth/Sex: 19-Jul-1941 (76 y.o. Female) Treating RN: Primary Care Khrista Braun: Gershon Crane Other Clinician: Jacqulyn Bath Referring Branston Halsted: Gershon Crane Treating Nadav Swindell/Extender: Tito Dine in Treatment: 5 Vital Signs Time Taken: 09:07 Temperature (F): 98.4 Height (in): 62 Pulse (bpm): 66 Weight (lbs): 148 Respiratory Rate (breaths/min): 16 Body Mass Index (BMI): 27.1 Blood Pressure (mmHg): 110/62 Capillary Blood Glucose (mg/dl): 296 Reference Range: 80 - 120 mg / dl Electronic Signature(s) Signed: 09/04/2017 12:04:34 PM By: Lorine Bears RCP, RRT, CHT Entered By: Lorine Bears on 09/04/2017 09:57:41

## 2017-09-05 ENCOUNTER — Encounter: Payer: Medicare Other | Admitting: Internal Medicine

## 2017-09-05 DIAGNOSIS — E11622 Type 2 diabetes mellitus with other skin ulcer: Secondary | ICD-10-CM | POA: Diagnosis not present

## 2017-09-05 LAB — GLUCOSE, CAPILLARY
GLUCOSE-CAPILLARY: 303 mg/dL — AB (ref 65–99)
GLUCOSE-CAPILLARY: 267 mg/dL — AB (ref 65–99)

## 2017-09-05 NOTE — Progress Notes (Signed)
Shari Turner, [place] ([id number]) Visit Report for 09/04/2017 HBO Details Patient Name: Shari Turner, Shari Turner Date of Service: 09/04/2017 10:00 AM Medical Record Number: [id number] Patient Account Number: [id number] Date of Birth/Sex: [birth year] (76 y.o. 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 HBO Treatment Details Treatment Number: 2 Patient Type: Outpatient Chamber Type: Monoplace Chamber Serial #: [id number] Treatment Protocol: 2.0 ATA with 90 minutes oxygen, and no air breaks Treatment Details Compression Rate Down: 1.5 psi / minute De-Compression Rate Up: 1.5 psi / minute Compress Tx Pressure Air breaks and breathing periods Decompress Decompress Begins Reached (leave unused spaces blank) Begins Ends Chamber Pressure (ATA) 1 2 - - - - - - 2 1 Clock Time (24 hr) 09:34 09:44 - - - - - - 11:14 11:24 Treatment Length: 110 (minutes) Treatment Segments: 4 Capillary Blood Glucose Pre Capillary Blood Glucose (mg/dl): Post Capillary Blood Glucose (mg/dl): Vital Signs Capillary Blood Glucose Reference Range: 80 - 120 mg / dl HBO Diabetic Blood Glucose Intervention Range: <131 mg/dl or >742 mg/dl Time Vitals Blood Respiratory Capillary Blood Glucose Pulse Action Type: Pulse: Temperature: Taken: Pressure: Rate: Glucose (mg/dl): Meter #: Oximetry (%) Taken: Pre 09:07 110/62 66 16 98.4 296 1 none; MD made aware Post 11:29 318 1 none; MD made aware Treatment Response Treatment Completion Status: Treatment Completed without Adverse Event Olaoluwa Grieder Notes no concerns with treatment given HBO Attestation I certify that I  supervised this HBO treatment in accordance with Medicare guidelines. A trained emergency response Yes team is readily available per hospital policies and procedures. Continue HBOT as ordered. Shari Turner, Shari Turner ([id number]) Electronic Signature(s) Signed: 09/04/2017 4:33:39 PM By: Baltazar Najjar MD Previous Signature: 09/04/2017 12:04:34 PM Version By: Dayton Martes RCP, RRT, CHT Entered By: Baltazar Najjar on 09/04/2017 12:27:04 Shari Turner ([id number]) -------------------------------------------------------------------------------- HBO Safety Checklist Details Patient Name: Shari Turner Date of Service: 09/04/2017 10:00 AM Medical Record Number: [id number] Patient Account Number: [id number] Date of Birth/Sex: [birth year] (76 y.o. Female) Treating RN: Primary Care Kelie Gainey: [name] Other Clinician: [name] Referring Shivaan Tierno: [name] Treating Flornce Record/Extender: Altamese Ralston in Treatment: 5 HBO Safety Checklist Items Safety Checklist Consent Form Signed Patient voided / foley secured and emptied When did you last eato 09/04/17 am Last dose of injectable or oral agent 09/04/17 am Ostomy pouch emptied and vented if applicable NA All implantable devices assessed, documented and approved NA Intravenous access site secured and place NA Valuables secured Linens and cotton and cotton/polyester blend (less than 51% polyester) Personal oil-based products / skin lotions / body lotions removed Wigs or hairpieces removed NA Smoking or tobacco materials removed NA Books / newspapers / magazines / loose paper removed Cologne, aftershave, perfume and deodorant removed Jewelry removed (may wrap wedding band) Make-up removed Hair care products removed Battery operated devices (external) removed Heating patches and chemical warmers removed Titanium eyewear removed NA Nail polish cured greater than 10 hours Casting material cured  greater than 10 hours NA Hearing aids removed NA Loose dentures or partials removed NA Prosthetics have been removed NA Patient demonstrates correct use of air break device (if applicable) Patient concerns have been addressed Patient grounding bracelet on and cord attached to chamber Specifics for Inpatients (complete in addition to above) Medication sheet sent with  patient Intravenous medications needed or due during therapy sent with patient Drainage tubes (e.g. nasogastric tube or chest tube secured and vented) Endotracheal or Tracheotomy tube secured Cuff deflated of air and inflated with saline Airway suctioned Electronic Signature(s) Signed: 09/04/2017 12:04:34 PM By: Dayton Martes RCP, RRT, CHT Shari Turner, Shari Turner ([id number]) Entered By: Dayton Martes on 09/04/2017 09:58:35

## 2017-09-06 ENCOUNTER — Encounter: Payer: Medicare Other | Admitting: Surgery

## 2017-09-06 DIAGNOSIS — E11622 Type 2 diabetes mellitus with other skin ulcer: Secondary | ICD-10-CM | POA: Diagnosis not present

## 2017-09-06 LAB — GLUCOSE, CAPILLARY
Glucose-Capillary: 243 mg/dL — ABNORMAL HIGH (ref 65–99)
Glucose-Capillary: 212 mg/dL — ABNORMAL HIGH (ref 65–99)

## 2017-09-06 NOTE — Progress Notes (Signed)
Shari, Turner ([id number]) Visit Report for 09/05/2017 Arrival Information Details Patient Name: Shari Turner, Shari Turner Date of Service: 09/05/2017 10:00 AM Medical Record Number: [id number] Patient Account Number: [id number] Date of Birth/Sex: 25-Feb-1941 (76 y.o. Female) Treating RN: Primary Care Ashely Goosby: Gershon Crane Other Clinician: Jacqulyn Bath Referring Taiesha Bovard: Gershon Crane Treating Yuan Gann/Extender: Tito Dine in Treatment: 5 Visit Information History Since Last Visit Added or deleted any medications: No Patient Arrived: Wheel Chair Any new allergies or adverse reactions: No Arrival Time: 09:12 Had a fall or experienced change in No Accompanied By: daughter, Shari Turner activities of daily living that may affect Transfer Assistance: Manual risk of falls: Patient Identification Verified: Yes Signs or symptoms of abuse/neglect since last visito No Secondary Verification Process Yes Hospitalized since last visit: No Completed: Pain Present Now: Yes Patient Has Alerts: Yes Patient Alerts: Patient on Blood Thinner DMII Eliquis Electronic Signature(s) Signed: 09/05/2017 11:43:39 AM By: Lorine Bears RCP, RRT, CHT Entered By: Lorine Bears on 09/05/2017 09:16:44 Shari Turner ([id number]) -------------------------------------------------------------------------------- Encounter Discharge Information Details Patient Name: Shari Turner Date of Service: 09/05/2017 10:00 AM Medical Record Number: [id number] Patient Account Number: [id number] Date of Birth/Sex: 08-22-1941 (76 y.o. Female) Treating RN: Primary Care Philemon Riedesel: Gershon Crane Other Clinician: Jacqulyn Bath Referring Jaquilla Woodroof: Gershon Crane Treating Lucillie Kiesel/Extender: Tito Dine in Treatment: 5 Encounter Discharge Information Items Discharge Pain Level: 2 Discharge Condition: Stable Ambulatory Status: Wheelchair Discharge Destination:  Home Transportation: Private Auto daughter, Accompanied By: Shari Turner Schedule Follow-up Appointment: No Medication Reconciliation completed and No provided to Patient/Care Leily Capek: Clinical Summary of Care: Notes Patient has an HBO treatment scheduled on 09/06/17 at 10:00 am. Electronic Signature(s) Signed: 09/05/2017 11:43:39 AM By: Lorine Bears RCP, RRT, CHT Entered By: Lorine Bears on 09/05/2017 11:43:19 Shari Turner ([id number]) -------------------------------------------------------------------------------- Vitals Details Patient Name: Shari Turner Date of Service: 09/05/2017 10:00 AM Medical Record Number: [id number] Patient Account Number: [id number] Date of Birth/Sex: 1941-03-11 (76 y.o. Female) Treating RN: Primary Care Ramell Wacha: Gershon Crane Other Clinician: Jacqulyn Bath Referring Diamantina Edinger: Gershon Crane Treating Loretha Ure/Extender: Tito Dine in Treatment: 5 Vital Signs Time Taken: 09:12 Temperature (F): 98.6 Height (in): 62 Pulse (bpm): 66 Weight (lbs): 148 Respiratory Rate (breaths/min): 16 Body Mass Index (BMI): 27.1 Blood Pressure (mmHg): 124/60 Capillary Blood Glucose (mg/dl): 303 Reference Range: 80 - 120 mg / dl Electronic Signature(s) Signed: 09/05/2017 11:43:39 AM By: Lorine Bears RCP, RRT, CHT Entered By: Lorine Bears on 09/05/2017 09:17:06

## 2017-09-06 NOTE — Progress Notes (Signed)
CAYMAN, FORT ([id number]) Visit Report for 09/05/2017 HBO Details Patient Name: Shari Turner, Shari Turner Date of Service: 09/05/2017 10:00 AM Medical Record Number: [id number] Patient Account Number: [id number] Date of Birth/Sex: 03-May-1941 (76 y.o. 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 HBO Treatment Details Treatment Number: 3 Patient Type: Outpatient Chamber Type: Monoplace Chamber Serial #: [id number] Treatment Protocol: 2.0 ATA with 90 minutes oxygen, and no air breaks Treatment Details Compression Rate Down: 1.5 psi / minute De-Compression Rate Up: 1.5 psi / minute Compress Tx Pressure Air breaks and breathing periods Decompress Decompress Begins Reached (leave unused spaces blank) Begins Ends Chamber Pressure (ATA) 1 2 - - - - - - 2 1 Clock Time (24 hr) 09:32 09:42 - - - - - - 11:12 11:22 Treatment Length: 110 (minutes) Treatment Segments: 4 Capillary Blood Glucose Pre Capillary Blood Glucose (mg/dl): Post Capillary Blood Glucose (mg/dl): Vital Signs Capillary Blood Glucose Reference Range: 80 - 120 mg / dl HBO Diabetic Blood Glucose Intervention Range: <131 mg/dl or >782 mg/dl Time Vitals Blood Respiratory Capillary Blood Glucose Pulse Action Type: Pulse: Temperature: Taken: Pressure: Rate: Glucose (mg/dl): Meter #: Oximetry (%) Taken: Pre 09:12 124/60 66 16 98.6 303 1 none; MD made aware Post 11:26 124/60 66 16 97.9 267 1 none; MD made aware Treatment Response Treatment Completion Status: Treatment Completed without Adverse Event Shari Turner Notes No concerns with treatment given HBO Attestation I  certify that I supervised this HBO treatment in accordance with Medicare guidelines. A trained emergency response Yes team is readily available per hospital policies and procedures. Continue HBOT as ordered. Shari Turner ([id number]) Electronic Signature(s) Signed: 09/05/2017 5:42:06 PM By: Shari Najjar MD Previous Signature: 09/05/2017 11:43:39 AM Version By: Shari Turner RCP, RRT, CHT Entered By: Shari Turner on 09/05/2017 12:30:03 Shari Turner ([id number]) -------------------------------------------------------------------------------- HBO Safety Checklist Details Patient Name: Shari Turner Date of Service: 09/05/2017 10:00 AM Medical Record Number: [id number] Patient Account Number: [id number] Date of Birth/Sex: 10/15/1940 (76 y.o. Female) Treating RN: Primary Care Shari Turner: Shari Turner Other Clinician: Izetta Turner Referring Shari Turner: Shari Turner Treating Shari Turner: Shari Turner in Treatment: 5 HBO Safety Checklist Items Safety Checklist Consent Form Signed Patient voided / foley secured and emptied When did you last eato 09/05/17 am Last dose of injectable or oral agent 11/28/189 am Ostomy pouch emptied and vented if applicable NA All implantable devices assessed, documented and approved NA Intravenous access site secured and place NA Valuables secured Linens and cotton and cotton/polyester blend (less than 51% polyester) Personal oil-based products / skin lotions / body lotions removed Wigs or hairpieces removed NA Smoking or tobacco materials removed NA Books / newspapers / magazines / loose paper removed Cologne, aftershave, perfume and deodorant removed Jewelry removed (may wrap wedding band) Make-up removed Hair care products removed Battery operated devices (external) removed Heating patches and chemical warmers removed Titanium eyewear removed NA Nail polish cured greater than 10  hours Casting material cured greater than 10 hours NA Hearing aids removed NA Loose dentures or partials removed NA Prosthetics have been removed NA Patient demonstrates correct use of air break device (if applicable) Patient concerns have been addressed Patient grounding bracelet on and cord attached to chamber Specifics for Inpatients (complete in addition to above)  Medication sheet sent with patient Intravenous medications needed or due during therapy sent with patient Drainage tubes (e.g. nasogastric tube or chest tube secured and vented) Endotracheal or Tracheotomy tube secured Cuff deflated of air and inflated with saline Airway suctioned Electronic Signature(s) Signed: 09/05/2017 11:43:39 AM By: Shari Turner RCP, RRT, CHT Shari Turner ([id number]) Entered By: Shari Turner on 09/05/2017 09:17:57

## 2017-09-07 ENCOUNTER — Encounter: Payer: Medicare Other | Admitting: Surgery

## 2017-09-07 DIAGNOSIS — E11622 Type 2 diabetes mellitus with other skin ulcer: Secondary | ICD-10-CM | POA: Diagnosis not present

## 2017-09-07 LAB — GLUCOSE, CAPILLARY
GLUCOSE-CAPILLARY: 225 mg/dL — AB (ref 65–99)
GLUCOSE-CAPILLARY: 234 mg/dL — AB (ref 65–99)

## 2017-09-07 NOTE — Progress Notes (Addendum)
[name], [name] ([id number]) Visit Report for 09/06/2017 HBO Details Patient Name: Shari Turner, Shari Turner Date of Service: 09/06/2017 10:00 AM Medical Record Number: [id number] Patient Account Number: [id number] Date of Birth/Sex: 02-27-1941 (76 y.o. 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 HBO Treatment Details Treatment Number: 4 Patient Type: Outpatient Chamber Type: Monoplace Chamber Serial #: [id number] Treatment Protocol: 2.0 ATA with 90 minutes oxygen, and no air breaks Treatment Details Compression Rate Down: 1.5 psi / minute De-Compression Rate Up: 1.5 psi / minute Compress Tx Pressure Air breaks and breathing periods Decompress Decompress Begins Reached (leave unused spaces blank) Begins Ends Chamber Pressure (ATA) 1 2 - - - - - - 2 1 Clock Time (24 hr) 09:34 09:44 - - - - - - 11:15 11:25 Treatment Length: 111 (minutes) Treatment Segments: 4 Capillary Blood Glucose Pre Capillary Blood Glucose (mg/dl): Post Capillary Blood Glucose (mg/dl): Vital Signs Capillary Blood Glucose Reference Range: 80 - 120 mg / dl HBO Diabetic Blood Glucose Intervention Range: <131 mg/dl or >595 mg/dl Time Vitals Blood Respiratory Capillary Blood Glucose Pulse Action Type: Pulse: Temperature: Taken: Pressure: Rate: Glucose (mg/dl): Meter #: Oximetry (%) Taken: Pre 09:14 122/60 72 16 98.1 243 1 none per protocol Post 02:12 112/64 66 16 98 1130 1 none per protocol Treatment Response Treatment Completion Status: Treatment Completed without Adverse Event HBO Attestation I certify that I supervised this HBO treatment in  accordance with Medicare guidelines. A trained emergency response Yes team is readily available per hospital policies and procedures. Continue HBOT as ordered. Yes Electronic Signature(s) DIAN, GROSMAN ([id number]) Signed: 09/07/2017 8:43:34 AM By: Sallee Provencal, RRT, CHT Signed: 09/07/2017 4:42:19 PM By: [name] MD, FACS Previous Signature: 09/06/2017 11:48:34 AM Version By: [name] MD, FACS Previous Signature: 09/06/2017 11:45:46 AM Version By: Dayton Martes RCP, RRT, CHT Entered By: Dayton Martes on 09/07/2017 08:43:34 Shari Turner ([id number]) -------------------------------------------------------------------------------- HBO Safety Checklist Details Patient Name: Shari Turner Date of Service: 09/06/2017 10:00 AM Medical Record Number: [id number] Patient Account Number: [id number] Date of Birth/Sex: Mar 12, 1941 (76 y.o. Female) Treating RN: Primary Care Manessa Buley: [name] Other Clinician: [name] Referring Diar Berkel: [name] Treating Khushboo Chuck/Extender: Rudene Re in Treatment: 5 HBO Safety Checklist Items Safety Checklist Consent Form Signed Patient voided / foley secured and emptied When did you last eato 09/06/17 am Last dose of injectable or oral agent 09/06/17 am Ostomy pouch emptied and vented if applicable NA All implantable devices assessed, documented and approved NA Intravenous access site secured and place NA Valuables secured Linens and cotton and cotton/polyester blend (less than 51% polyester) Personal oil-based products / skin lotions / body lotions removed Wigs or hairpieces removed NA Smoking or tobacco materials removed NA Books / newspapers / magazines / loose paper removed Cologne, aftershave, perfume and deodorant removed Jewelry removed (may wrap wedding band) Make-up removed Hair care products removed Battery operated devices (external)  removed Heating patches and chemical warmers removed Titanium eyewear removed NA Nail polish cured greater than 10 hours Casting material cured greater than 10 hours NA Hearing aids removed NA Loose dentures or partials removed NA Prosthetics have been removed NA Patient demonstrates correct use of air break device (if applicable) Patient concerns have been addressed Patient grounding bracelet on  and cord attached to chamber Specifics for Inpatients (complete in addition to above) Medication sheet sent with patient Intravenous medications needed or due during therapy sent with patient Drainage tubes (e.g. nasogastric tube or chest tube secured and vented) Endotracheal or Tracheotomy tube secured Cuff deflated of air and inflated with saline Airway suctioned Electronic Signature(s) Signed: 09/06/2017 11:48:23 AM By: [name] MD, FACS Previous Signature: 09/06/2017 11:45:46 AM Version By: Dayton Martes RCP, RRT, CHT [name], [name] ([id number]) Entered By: [name] on 09/06/2017 11:48:22

## 2017-09-07 NOTE — Progress Notes (Signed)
Shari Turner, Shari Turner ([id number]) Visit Report for 09/06/2017 Arrival Information Details Patient Name: Shari Turner, Shari Turner Date of Service: 09/06/2017 10:00 AM Medical Record Number: [id number] Patient Account Number: [id number] Date of Birth/Sex: 06-26-1941 (76 y.o. Female) Treating RN: Primary Care Davonna Ertl: Gershon Crane Other Clinician: Jacqulyn Bath Referring Lilliah Priego: Gershon Crane Treating Bentley Haralson/Extender: Frann Rider in Treatment: 5 Visit Information History Since Last Visit Added or deleted any medications: No Patient Arrived: Wheel Chair Any new allergies or adverse reactions: No Arrival Time: 09:10 Had a fall or experienced change in No Accompanied By: daughter, Georgina Peer activities of daily living that may affect Transfer Assistance: Manual risk of falls: Patient Identification Verified: Yes Signs or symptoms of abuse/neglect since last visito No Secondary Verification Process Yes Hospitalized since last visit: No Completed: Pain Present Now: Yes Patient Has Alerts: Yes Patient Alerts: Patient on Blood Thinner DMII Eliquis Electronic Signature(s) Signed: 09/06/2017 11:45:46 AM By: Lorine Bears RCP, RRT, CHT Entered By: Lorine Bears on 09/06/2017 09:22:10 Shari Turner ([id number]) -------------------------------------------------------------------------------- Encounter Discharge Information Details Patient Name: Shari Turner Date of Service: 09/06/2017 10:00 AM Medical Record Number: [id number] Patient Account Number: [id number] Date of Birth/Sex: 1941/02/07 (76 y.o. Female) Treating RN: Primary Care Laurenashley Viar: Gershon Crane Other Clinician: Jacqulyn Bath Referring Aoife Bold: Gershon Crane Treating Marbin Olshefski/Extender: Frann Rider in Treatment: 5 Encounter Discharge Information Items Discharge Pain Level: 0 Discharge Condition: Stable Ambulatory Status: Wheelchair Discharge Destination:  Home Transportation: Private Auto daughter, Accompanied By: Georgina Peer Schedule Follow-up Appointment: No Medication Reconciliation completed and No provided to Patient/Care Trejon Duford: Clinical Summary of Care: Notes Patient has an HBO treatment scheduled on 09/07/17 at 10:00 am. Electronic Signature(s) Signed: 09/06/2017 11:45:46 AM By: Lorine Bears RCP, RRT, CHT Entered By: Lorine Bears on 09/06/2017 11:42:03 Shari Turner ([id number]) -------------------------------------------------------------------------------- Vitals Details Patient Name: Shari Turner Date of Service: 09/06/2017 10:00 AM Medical Record Number: [id number] Patient Account Number: [id number] Date of Birth/Sex: December 06, 1940 (76 y.o. Female) Treating RN: Primary Care Daryl Quiros: Gershon Crane Other Clinician: Jacqulyn Bath Referring Harlowe Dowler: Gershon Crane Treating Jeris Easterly/Extender: Frann Rider in Treatment: 5 Vital Signs Time Taken: 09:14 Temperature (F): 98.1 Height (in): 62 Pulse (bpm): 72 Weight (lbs): 148 Respiratory Rate (breaths/min): 16 Body Mass Index (BMI): 27.1 Blood Pressure (mmHg): 122/60 Capillary Blood Glucose (mg/dl): 243 Reference Range: 80 - 120 mg / dl Electronic Signature(s) Signed: 09/06/2017 11:45:46 AM By: Lorine Bears RCP, RRT, CHT Entered By: Becky Sax, Amado Nash on 09/06/2017 09:22:37

## 2017-09-08 NOTE — Progress Notes (Signed)
Shari, Turner ([id number]) Visit Report for 09/07/2017 Arrival Information Details Patient Name: Shari Turner, Shari Turner Date of Service: 09/07/2017 10:00 AM Medical Record Number: [id number] Patient Account Number: [id number] Date of Birth/Sex: 01-24-1941 (76 y.o. Female) Treating RN: Primary Care Laurian Edrington: Gershon Crane Other Clinician: Jacqulyn Bath Referring Annella Prowell: Gershon Crane Treating Starlyn Droge/Extender: Frann Rider in Treatment: 6 Visit Information History Since Last Visit Added or deleted any medications: No Patient Arrived: Wheel Chair Any new allergies or adverse reactions: No Arrival Time: 09:10 Had a fall or experienced change in No Accompanied By: granddaughter, Arby Barrette activities of daily living that may affect Transfer Assistance: Manual risk of falls: Patient Identification Verified: Yes Signs or symptoms of abuse/neglect since last visito No Secondary Verification Process Yes Hospitalized since last visit: No Completed: Pain Present Now: Yes Patient Has Alerts: Yes Patient Alerts: Patient on Blood Thinner DMII Eliquis Electronic Signature(s) Signed: 09/07/2017 11:44:58 AM By: Lorine Bears RCP, RRT, CHT Entered By: Lorine Bears on 09/07/2017 09:34:23 Shari Turner ([id number]) -------------------------------------------------------------------------------- Encounter Discharge Information Details Patient Name: Shari Turner Date of Service: 09/07/2017 10:00 AM Medical Record Number: [id number] Patient Account Number: [id number] Date of Birth/Sex: 05-30-1941 (76 y.o. Female) Treating RN: Primary Care Tahjanae Blankenburg: Gershon Crane Other Clinician: Jacqulyn Bath Referring Murel Shenberger: Gershon Crane Treating Boysie Bonebrake/Extender: Frann Rider in Treatment: 6 Encounter Discharge Information Items Discharge Condition: Stable Ambulatory Status: Wheelchair Discharge Destination: Home Transportation: Private  Auto granddaughter, Accompanied By: Arby Barrette Schedule Follow-up Appointment: No Medication Reconciliation completed and No provided to Patient/Care Chelcee Korpi: Clinical Summary of Care: Notes Patient has an HBO treatment scheduled on 09/10/17 at 10:00 am. Electronic Signature(s) Signed: 09/07/2017 11:44:58 AM By: Lorine Bears RCP, RRT, CHT Entered By: Lorine Bears on 09/07/2017 11:44:40 Shari Turner ([id number]) -------------------------------------------------------------------------------- Vitals Details Patient Name: Shari Turner Date of Service: 09/07/2017 10:00 AM Medical Record Number: [id number] Patient Account Number: [id number] Date of Birth/Sex: 1940-12-27 (76 y.o. Female) Treating RN: Primary Care Adamary Savary: Gershon Crane Other Clinician: Jacqulyn Bath Referring Naziya Hegwood: Gershon Crane Treating Rubbie Goostree/Extender: Frann Rider in Treatment: 6 Vital Signs Time Taken: 09:09 Temperature (F): 98.5 Height (in): 62 Pulse (bpm): 72 Weight (lbs): 148 Respiratory Rate (breaths/min): 16 Body Mass Index (BMI): 27.1 Blood Pressure (mmHg): 112/62 Capillary Blood Glucose (mg/dl): 225 Reference Range: 80 - 120 mg / dl Electronic Signature(s) Signed: 09/07/2017 11:44:58 AM By: Lorine Bears RCP, RRT, CHT Entered By: Lorine Bears on 09/07/2017 09:35:18

## 2017-09-09 NOTE — Progress Notes (Signed)
Shari Turner ([id number]) Visit Report for 09/07/2017 HBO Details Patient Name: Shari Turner, Shari Turner Date of Service: 09/07/2017 10:00 AM Medical Record Number: [id number] Patient Account Number: [id number] Date of Birth/Sex: [birth year] (76 y.o. 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 HBO Treatment Details Treatment Number: 5 Patient Type: Outpatient Chamber Type: Monoplace Chamber Serial #: [id number] Treatment Protocol: 2.0 ATA with 90 minutes oxygen, and no air breaks Treatment Details Compression Rate Down: 1.5 psi / minute De-Compression Rate Up: 1.5 psi / minute Compress Tx Pressure Air breaks and breathing periods Decompress Decompress Begins Reached (leave unused spaces blank) Begins Ends Chamber Pressure (ATA) 1 2 - - - - - - 2 1 Clock Time (24 hr) 09:31 09:41 - - - - - - 11:12 11:22 Treatment Length: 111 (minutes) Treatment Segments: 4 Capillary Blood Glucose Pre Capillary Blood Glucose (mg/dl): Post Capillary Blood Glucose (mg/dl): Vital Signs Capillary Blood Glucose Reference Range: 80 - 120 mg / dl HBO Diabetic Blood Glucose Intervention Range: <131 mg/dl or >166 mg/dl Time Vitals Blood Respiratory Capillary Blood Glucose Pulse Action Type: Pulse: Temperature: Taken: Pressure: Rate: Glucose (mg/dl): Meter #: Oximetry (%) Taken: Pre 09:09 112/62 72 16 98.5 225 1 none per protocol Post 11:26 124/60 78 16 98.1 234 1 none per protocol Treatment Response Treatment Completion Status: Treatment Completed without Adverse Event Electronic Signature(s) Signed: 09/07/2017 11:44:58 AM By: Dayton Martes RCP, RRT, CHT Signed: 09/07/2017 4:42:19 PM By: [name] MD, FACS Previous Signature: 09/07/2017 11:27:07 AM Version By: [name] MD, FACS Entered By: Dayton Martes on 09/07/2017 11:43:31 Shari Turner ([id number]) -------------------------------------------------------------------------------- HBO Safety Checklist Details Patient Name: Shari Turner Date of Service: 09/07/2017 10:00 AM Medical Record Number: [id number] Patient Account Number: [id number] Date of Birth/Sex: 13-Nov-1940 (76 y.o. Female) Treating RN: Primary Care Shekinah Pitones: [name] Other Clinician: [name] Referring Naidelyn Parrella: [name] Treating Joyanna Kleman/Extender: Rudene Re in Treatment: 6 HBO Safety Checklist Items Safety Checklist Consent Form Signed Patient voided / foley secured and emptied When did you last eato 09/07/17 am Last dose of injectable or oral agent 09/07/17 am Ostomy pouch emptied and vented if applicable NA All implantable devices assessed, documented and approved NA Intravenous access site secured and place NA Valuables secured NA Linens and cotton and cotton/polyester blend (less than 51% polyester) Personal oil-based products / skin lotions / body lotions removed Wigs or hairpieces removed NA Smoking or tobacco materials removed NA Books / newspapers / magazines / loose paper removed Cologne, aftershave, perfume and deodorant removed Jewelry removed (may wrap wedding band) Make-up removed Hair care products removed Battery operated devices (external) removed Heating patches and chemical warmers removed Titanium eyewear removed NA Nail polish cured greater than 10 hours Casting material cured greater than 10 hours NA Hearing aids removed NA Loose dentures or partials removed NA Prosthetics have been removed NA Patient demonstrates correct use of air break device (if applicable) Patient concerns have  been addressed Patient grounding bracelet on and cord attached to chamber Specifics for Inpatients (complete in addition to above) Medication sheet sent with patient Intravenous medications needed or due during therapy sent with patient Drainage tubes (e.g. nasogastric tube or chest tube secured and vented) Endotracheal or Tracheotomy tube secured Cuff deflated of air and  inflated with saline Airway suctioned Electronic Signature(s) Signed: 09/07/2017 11:44:58 AM By: Dayton Martes RCP, RRT, CHT [place], [name] ([id number]) Entered By: Dayton Martes on 09/07/2017 09:36:18

## 2017-09-10 ENCOUNTER — Encounter: Payer: Medicare Other | Attending: Physician Assistant | Admitting: Physician Assistant

## 2017-09-10 DIAGNOSIS — L599 Disorder of the skin and subcutaneous tissue related to radiation, unspecified: Secondary | ICD-10-CM | POA: Diagnosis not present

## 2017-09-10 DIAGNOSIS — N3040 Irradiation cystitis without hematuria: Secondary | ICD-10-CM | POA: Insufficient documentation

## 2017-09-10 DIAGNOSIS — K520 Gastroenteritis and colitis due to radiation: Secondary | ICD-10-CM | POA: Diagnosis not present

## 2017-09-10 DIAGNOSIS — M8668 Other chronic osteomyelitis, other site: Secondary | ICD-10-CM | POA: Insufficient documentation

## 2017-09-10 DIAGNOSIS — E11622 Type 2 diabetes mellitus with other skin ulcer: Secondary | ICD-10-CM | POA: Insufficient documentation

## 2017-09-10 LAB — GLUCOSE, CAPILLARY
GLUCOSE-CAPILLARY: 196 mg/dL — AB (ref 65–99)
GLUCOSE-CAPILLARY: 230 mg/dL — AB (ref 65–99)

## 2017-09-10 NOTE — Progress Notes (Signed)
SHERYN, ALDAZ ([id number]) Visit Report for 09/10/2017 Arrival Information Details Patient Name: Shari Turner, Shari Turner Date of Service: 09/10/2017 10:00 AM Medical Record Number: [id number] Patient Account Number: [id number] Date of Birth/Sex: 11-Jul-1941 (76 y.o. Female) Treating RN: Primary Care Dhani Imel: Gershon Crane Other Clinician: Jacqulyn Bath Referring Odessa Nishi: Gershon Crane Treating Latika Kronick/Extender: Melburn Hake, [name] Weeks in Treatment: 6 Visit Information History Since Last Visit Added or deleted any medications: No Patient Arrived: Wheel Chair Any new allergies or adverse reactions: No Arrival Time: 09:03 Had a fall or experienced change in No Accompanied By: granddaughter, [name] activities of daily living that may affect Transfer Assistance: Manual risk of falls: Patient Identification Verified: Yes Signs or symptoms of abuse/neglect since last visito No Secondary Verification Process Yes Hospitalized since last visit: No Completed: Pain Present Now: No Patient Has Alerts: Yes Patient Alerts: Patient on Blood Thinner DMII Eliquis Electronic Signature(s) Signed: 09/10/2017 11:35:44 AM By: Lorine Bears RCP, RRT, CHT Entered By: Lorine Bears on 09/10/2017 09:08:58 Shari Turner ([id number]) -------------------------------------------------------------------------------- Encounter Discharge Information Details Patient Name: Shari Turner Date of Service: 09/10/2017 10:00 AM Medical Record Number: [id number] Patient Account Number: [id number] Date of Birth/Sex: 31-Oct-1940 (76 y.o. Female) Treating RN: Primary Care Omnia Dollinger: Gershon Crane Other Clinician: Jacqulyn Bath Referring Shonna Deiter: Gershon Crane Treating Mohan Erven/Extender: Melburn Hake, [name] Weeks in Treatment: 6 Encounter Discharge Information Items Discharge Pain Level: 0 Discharge Condition: Stable Ambulatory Status: Wheelchair Discharge Destination:  Home Transportation: Private Auto granddaughter, Accompanied By: Arby Barrette Schedule Follow-up Appointment: No Medication Reconciliation completed and No provided to Patient/Care Rakayla Ricklefs: Clinical Summary of Care: Notes Patient has an HBO treatment scheduled on 09/11/17 at 10:00 am. Electronic Signature(s) Signed: 09/10/2017 11:35:44 AM By: Lorine Bears RCP, RRT, CHT Entered By: Lorine Bears on 09/10/2017 11:35:28 Shari Turner ([id number]) -------------------------------------------------------------------------------- Vitals Details Patient Name: Shari Turner Date of Service: 09/10/2017 10:00 AM Medical Record Number: [id number] Patient Account Number: [id number] Date of Birth/Sex: [name], 1941 (76 y.o. Female) Treating RN: Primary Care Rilley Poulter: Gershon Crane Other Clinician: Jacqulyn Bath Referring Riven Beebe: Gershon Crane Treating Nikiah Goin/Extender: Melburn Hake, [name] Weeks in Treatment: 6 Vital Signs Time Taken: 09:04 Temperature (F): 98.5 Height (in): 62 Pulse (bpm): 72 Weight (lbs): 148 Respiratory Rate (breaths/min): 16 Body Mass Index (BMI): 27.1 Blood Pressure (mmHg): 124/58 Capillary Blood Glucose (mg/dl): 196 Reference Range: 80 - 120 mg / dl Electronic Signature(s) Signed: 09/10/2017 11:35:44 AM By: Lorine Bears RCP, RRT, CHT Entered By: Becky Sax, Amado Nash on 09/10/2017 09:09:25

## 2017-09-11 ENCOUNTER — Encounter: Payer: Medicare Other | Admitting: Physician Assistant

## 2017-09-11 DIAGNOSIS — E11622 Type 2 diabetes mellitus with other skin ulcer: Secondary | ICD-10-CM | POA: Diagnosis not present

## 2017-09-11 LAB — GLUCOSE, CAPILLARY
GLUCOSE-CAPILLARY: 211 mg/dL — AB (ref 65–99)
GLUCOSE-CAPILLARY: 292 mg/dL — AB (ref 65–99)

## 2017-09-11 NOTE — Progress Notes (Addendum)
NATAIJA, SIRKIN ([id number]) Visit Report for 09/10/2017 HBO Details Patient Name: Shari Turner, Shari Turner Date of Service: 09/10/2017 10:00 AM Medical Record Number: [id number] Patient Account Number: [id number] Date of Birth/Sex: June 05, 1941 (76 y.o. 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 HBO Treatment Details Treatment Number: 6 Patient Type: Outpatient Chamber Type: Monoplace Chamber Serial #: [id number] Treatment Protocol: 2.0 ATA with 90 minutes oxygen, and no air breaks Treatment Details Compression Rate Down: 1.5 psi / minute De-Compression Rate Up: 1.5 psi / minute Compress Tx Pressure Air breaks and breathing periods Decompress Decompress Begins Reached (leave unused spaces blank) Begins Ends Chamber Pressure (ATA) 1 2 - - - - - - 2 1 Clock Time (24 hr) 09:26 09:36 - - - - - - 11:07 11:17 Treatment Length: 111 (minutes) Treatment Segments: 4 Capillary Blood Glucose Pre Capillary Blood Glucose (mg/dl): Post Capillary Blood Glucose (mg/dl): Vital Signs Capillary Blood Glucose Reference Range: 80 - 120 mg / dl HBO Diabetic Blood Glucose Intervention Range: <131 mg/dl or >782 mg/dl Time Vitals Blood Respiratory Capillary Blood Glucose Pulse Action Type: Pulse: Temperature: Taken: Pressure: Rate: Glucose (mg/dl): Meter #: Oximetry (%) Taken: Pre 09:04 124/58 72 16 98.5 196 1 none per protocol Post 11:22 120/56 72 16 98 236 1 none per protocol Treatment Response Treatment Completion Status: Treatment Completed without Adverse Event Mlissa Tamayo Notes Patient is tolerating HBO very well. She generally sleeps during  the treatment. HBO Attestation I certify that I supervised this HBO treatment in accordance with Medicare guidelines. A trained emergency response Yes team is readily available per hospital policies and procedures. Continue HBOT as ordered. SERENITEE, HILLEGAS ([id number]) Electronic Signature(s) Signed: 09/11/2017 11:28:38 AM By: [name] PA-C Previous Signature: 09/10/2017 11:35:44 AM Version By: Dayton Martes RCP, RRT, CHT Previous Signature: 09/10/2017 5:27:57 PM Version By: [name] PA-C Entered By: [name] on 09/11/2017 11:28:09 [name] ([id number]) -------------------------------------------------------------------------------- HBO Safety Checklist Details Patient Name: [name] Date of Service: 09/10/2017 10:00 AM Medical Record Number: [id number] Patient Account Number: [id number] Date of Birth/Sex: [birth year] (76 y.o. Female) Treating RN: Primary Care Krystina Strieter: [name] Other Clinician: [name] Referring Zivah Mayr: [name] Treating Taniqua Issa/Extender: [name], [name] Weeks in Treatment: 6 HBO Safety Checklist Items Safety Checklist Consent Form Signed Patient voided / foley secured and emptied When did you last eato 09/10/17 am Last dose of injectable or oral agent 09/10/17 am Ostomy pouch emptied and vented if applicable NA All implantable devices assessed, documented and approved NA Intravenous access site secured and place NA Valuables secured Linens and cotton and cotton/polyester blend (less than 51% polyester) Personal oil-based products / skin lotions / body lotions removed Wigs or hairpieces removed NA Smoking or tobacco materials removed NA Books / newspapers / magazines / loose paper removed Cologne, aftershave, perfume and deodorant removed Jewelry removed (may wrap wedding band) Make-up removed Hair care products removed Battery operated devices (external) removed Heating patches  and chemical warmers removed Titanium eyewear removed NA Nail polish cured greater than 10 hours Casting material cured greater than 10 hours NA Hearing aids removed NA Loose dentures or partials removed NA Prosthetics have been removed NA Patient demonstrates correct use of air break device (if applicable) Patient concerns have been  addressed Patient grounding bracelet on and cord attached to chamber Specifics for Inpatients (complete in addition to above) Medication sheet sent with patient Intravenous medications needed or due during therapy sent with patient Drainage tubes (e.g. nasogastric tube or chest tube secured and vented) Endotracheal or Tracheotomy tube secured Cuff deflated of air and inflated with saline Airway suctioned Electronic Signature(s) Signed: 09/10/2017 11:35:44 AM By: Dayton Martes RCP, RRT, CHT [place], [name] ([id number]) Entered By: Dayton Martes on 09/10/2017 09:10:18

## 2017-09-12 ENCOUNTER — Encounter: Payer: Medicare Other | Admitting: Internal Medicine

## 2017-09-12 DIAGNOSIS — E11622 Type 2 diabetes mellitus with other skin ulcer: Secondary | ICD-10-CM | POA: Diagnosis not present

## 2017-09-12 LAB — GLUCOSE, CAPILLARY
GLUCOSE-CAPILLARY: 235 mg/dL — AB (ref 65–99)
Glucose-Capillary: 220 mg/dL — ABNORMAL HIGH (ref 65–99)

## 2017-09-12 NOTE — Progress Notes (Addendum)
Shari Turner, Shari Turner ([id number]) Visit Report for 09/11/2017 Arrival Information Details Patient Name: Shari Turner Date of Service: 09/11/2017 10:00 AM Medical Record Number: [id number] Patient Account Number: [id number] Date of Birth/Sex: 1941/01/11 (76 y.o. Female) Treating RN: Primary Care Barbara Ahart: Gershon Crane Other Clinician: Jacqulyn Bath Referring Merl Guardino: Gershon Crane Treating Tel Hevia/Extender: Melburn Hake, [name] Weeks in Treatment: 6 Visit Information History Since Last Visit Added or deleted any medications: No Patient Arrived: Wheel Chair Any new allergies or adverse reactions: No Arrival Time: 09:10 Had a fall or experienced change in No Accompanied By: granddaughter, [name] activities of daily living that may affect Transfer Assistance: Manual risk of falls: Patient Identification Verified: Yes Signs or symptoms of abuse/neglect since last visito No Secondary Verification Process Yes Hospitalized since last visit: No Completed: Has Dressing in Place as Prescribed: Yes Patient Has Alerts: Yes Pain Present Now: No Patient Alerts: Patient on Blood Thinner DMII Eliquis Electronic Signature(s) Signed: 09/11/2017 3:27:53 PM By: Lorine Bears RCP, RRT, CHT Entered By: Lorine Bears on 09/11/2017 09:35:45 Shari Turner ([id number]) -------------------------------------------------------------------------------- Encounter Discharge Information Details Patient Name: Shari Turner Date of Service: 09/11/2017 10:00 AM Medical Record Number: [id number] Patient Account Number: [id number] Date of Birth/Sex: 1941-02-18 (76 y.o. Female) Treating RN: Primary Care Billy Turvey: Gershon Crane Other Clinician: Jacqulyn Bath Referring Devota Viruet: Gershon Crane Treating Thurmon Mizell/Extender: Melburn Hake, [name] Weeks in Treatment: 6 Encounter Discharge Information Items Discharge Pain Level: 0 Discharge Condition: Stable Ambulatory Status:  Wheelchair Discharge Destination: Home Transportation: Private Auto granddaughter, Accompanied By: Arby Barrette Schedule Follow-up Appointment: No Medication Reconciliation completed and No provided to Patient/Care Christian Treadway: Clinical Summary of Care: Notes Patient has an HBO treatment scheduled on 09/12/17 at 10:00 am. Electronic Signature(s) Signed: 09/20/2017 9:51:18 AM By: Lorine Bears RCP, RRT, CHT Previous Signature: 09/11/2017 3:27:53 PM Version By: Lorine Bears RCP, RRT, CHT Entered By: Lorine Bears on 09/20/2017 09:50:45 Shari Turner ([id number]) -------------------------------------------------------------------------------- Vitals Details Patient Name: Shari Turner Date of Service: 09/11/2017 10:00 AM Medical Record Number: [id number] Patient Account Number: [id number] Date of Birth/Sex: 01-03-1941 (76 y.o. Female) Treating RN: Primary Care Traveon Louro: Gershon Crane Other Clinician: Jacqulyn Bath Referring David Rodriquez: Gershon Crane Treating Yolande Skoda/Extender: Melburn Hake, [name] Weeks in Treatment: 6 Vital Signs Time Taken: 09:13 Temperature (F): 98.8 Height (in): 62 Pulse (bpm): 66 Weight (lbs): 148 Respiratory Rate (breaths/min): 16 Body Mass Index (BMI): 27.1 Blood Pressure (mmHg): 126/60 Capillary Blood Glucose (mg/dl): 211 Reference Range: 80 - 120 mg / dl Electronic Signature(s) Signed: 09/11/2017 3:27:53 PM By: Lorine Bears RCP, RRT, CHT Entered By: Lorine Bears on 09/11/2017 09:36:11

## 2017-09-13 ENCOUNTER — Encounter: Payer: Medicare Other | Admitting: Surgery

## 2017-09-13 DIAGNOSIS — E11622 Type 2 diabetes mellitus with other skin ulcer: Secondary | ICD-10-CM | POA: Diagnosis not present

## 2017-09-13 LAB — GLUCOSE, CAPILLARY
GLUCOSE-CAPILLARY: 250 mg/dL — AB (ref 65–99)
Glucose-Capillary: 209 mg/dL — ABNORMAL HIGH (ref 65–99)

## 2017-09-13 NOTE — Progress Notes (Signed)
Shari Turner, Shari Turner ([id number]) Visit Report for 09/12/2017 Arrival Information Details Patient Name: Shari Turner, Shari Turner Date of Service: 09/12/2017 10:00 AM Medical Record Number: [id number] Patient Account Number: [id number] Date of Birth/Sex: 03-Nov-1940 (76 y.o. Female) Treating RN: Primary Care Briselda Naval: Gershon Crane Other Clinician: Jacqulyn Bath Referring Hiawatha Merriott: Gershon Crane Treating Joice Nazario/Extender: Tito Dine in Treatment: 6 Visit Information History Since Last Visit Added or deleted any medications: No Patient Arrived: Wheel Chair Any new allergies or adverse reactions: No Arrival Time: 09:05 Had a fall or experienced change in No Accompanied By: granddaughter, [name] activities of daily living that may affect Transfer Assistance: Manual risk of falls: Patient Identification Verified: Yes Signs or symptoms of abuse/neglect since last visito No Secondary Verification Process Yes Hospitalized since last visit: No Completed: Pain Present Now: No Patient Has Alerts: Yes Patient Alerts: Patient on Blood Thinner DMII Eliquis Electronic Signature(s) Signed: 09/12/2017 4:41:22 PM By: Lorine Bears RCP, RRT, CHT Entered By: Lorine Bears on 09/12/2017 09:16:19 Shari Turner ([id number]) -------------------------------------------------------------------------------- Encounter Discharge Information Details Patient Name: Shari Turner Date of Service: 09/12/2017 10:00 AM Medical Record Number: [id number] Patient Account Number: [id number] Date of Birth/Sex: 1941/06/05 (76 y.o. Female) Treating RN: Primary Care Sydne Krahl: Gershon Crane Other Clinician: Jacqulyn Bath Referring Roniyah Llorens: Gershon Crane Treating Edris Friedt/Extender: Tito Dine in Treatment: 6 Encounter Discharge Information Items Discharge Pain Level: 0 Discharge Condition: Stable Ambulatory Status: Wheelchair Discharge Destination:  Home Transportation: Private Auto granddaughter, Accompanied By: Arby Barrette Schedule Follow-up Appointment: No Medication Reconciliation completed and No provided to Patient/Care Keola Heninger: Clinical Summary of Care: Notes Patient has an HBO treatment scheduled on 09/13/17 at 10:00 am. Electronic Signature(s) Signed: 09/12/2017 4:41:22 PM By: Lorine Bears RCP, RRT, CHT Entered By: Lorine Bears on 09/12/2017 11:35:21 Shari Turner ([id number]) -------------------------------------------------------------------------------- Vitals Details Patient Name: Shari Turner Date of Service: 09/12/2017 10:00 AM Medical Record Number: [id number] Patient Account Number: [id number] Date of Birth/Sex: 09-Oct-1941 (76 y.o. Female) Treating RN: Primary Care Arsh Feutz: Gershon Crane Other Clinician: Jacqulyn Bath Referring Cherylanne Ardelean: Gershon Crane Treating Naziyah Tieszen/Extender: Tito Dine in Treatment: 6 Vital Signs Time Taken: 09:05 Temperature (F): 99.0 Height (in): 62 Pulse (bpm): 66 Weight (lbs): 148 Respiratory Rate (breaths/min): 16 Body Mass Index (BMI): 27.1 Blood Pressure (mmHg): 118/60 Capillary Blood Glucose (mg/dl): 235 Reference Range: 80 - 120 mg / dl Electronic Signature(s) Signed: 09/12/2017 4:41:22 PM By: Lorine Bears RCP, RRT, CHT Entered By: Lorine Bears on 09/12/2017 09:16:40

## 2017-09-14 ENCOUNTER — Encounter: Payer: Medicare Other | Admitting: Surgery

## 2017-09-14 DIAGNOSIS — E11622 Type 2 diabetes mellitus with other skin ulcer: Secondary | ICD-10-CM | POA: Diagnosis not present

## 2017-09-14 LAB — GLUCOSE, CAPILLARY
GLUCOSE-CAPILLARY: 204 mg/dL — AB (ref 65–99)
Glucose-Capillary: 210 mg/dL — ABNORMAL HIGH (ref 65–99)

## 2017-09-14 NOTE — Progress Notes (Signed)
SYMPHONY, DEMURO ([id number]) Visit Report for 09/13/2017 Physician Orders Details Patient Name: Shari Turner, Shari Turner Date of Service: 09/13/2017 10:00 AM Medical Record Number: [id number] Patient Account Number: [id number] Date of Birth/Sex: 07-20-1941 (76 y.o. 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, unspecified Facility Procedures CPT4 Code: 58832549 Description: (Facility Use Only) HBOT, full body chamber, 28min Modifier: Quantity: 4 Physician Procedures CPT4 Code: 8264158 Description: 30940 - WC PHYS HYPERBARIC OXYGEN THERAPY ICD-10 Diagnosis Description M86.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

## 2017-09-14 NOTE — Progress Notes (Signed)
Shari Turner, Shari Turner ([id number]) Visit Report for 09/12/2017 HBO Details Patient Name: Shari Turner, Shari Turner Date of Service: 09/12/2017 10:00 AM Medical Record Number: [id number] Patient Account Number: [id number] Date of Birth/Sex: 1941/05/22 (76 y.o. 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 HBO Treatment Details Treatment Number: 7 Patient Type: Outpatient Chamber Type: Monoplace Chamber Serial #: [id number] Treatment Protocol: 2.0 ATA with 90 minutes oxygen, and no air breaks Treatment Details Compression Rate Down: 1.5 psi / minute De-Compression Rate Up: 1.5 psi / minute Compress Tx Pressure Air breaks and breathing periods Decompress Decompress Begins Reached (leave unused spaces blank) Begins Ends Chamber Pressure (ATA) 1 2 - - - - - - 2 1 Clock Time (24 hr) 09:26 09:36 - - - - - - 11:06 11:16 Treatment Length: 110 (minutes) Treatment Segments: 4 Capillary Blood Glucose Pre Capillary Blood Glucose (mg/dl): Post Capillary Blood Glucose (mg/dl): Vital Signs Capillary Blood Glucose Reference Range: 80 - 120 mg / dl HBO Diabetic Blood Glucose Intervention Range: <131 mg/dl or >782 mg/dl Time Vitals Blood Respiratory Capillary Blood Glucose Pulse Action Type: Pulse: Temperature: Taken: Pressure: Rate: Glucose (mg/dl): Meter #: Oximetry (%) Taken: Pre 09:05 118/60 66 16 99 235 1 none per protocol Post 11:20 124/60 84 16 98.5 220 1 none per protocol Treatment Response Treatment Completion Status: Treatment Completed without Adverse Event Infant Zink Notes no concerns with treatment given HBO Attestation I certify  that I supervised this HBO treatment in accordance with Medicare guidelines. A trained emergency response Yes team is readily available per hospital policies and procedures. Continue HBOT as ordered. Shari Turner, Shari Turner ([id number]) Electronic Signature(s) Signed: 09/12/2017 5:29:04 PM By: Baltazar Najjar MD Previous Signature: 09/12/2017 4:41:22 PM Version By: Dayton Martes RCP, RRT, CHT Entered By: Baltazar Najjar on 09/12/2017 17:23:00 Shari Turner ([id number]) -------------------------------------------------------------------------------- HBO Safety Checklist Details Patient Name: Shari Turner Date of Service: 09/12/2017 10:00 AM Medical Record Number: [id number] Patient Account Number: [id number] Date of Birth/Sex: 07/10/1941 (76 y.o. Female) Treating RN: Primary Care Marian Meneely: [name] Other Clinician: [name] Referring Lulabelle Desta: [name] Treating Marielle Mantione/Extender: Altamese Bradford in Treatment: 6 HBO Safety Checklist Items Safety Checklist Consent Form Signed Patient voided / foley secured and emptied When did you last eato 09/12/17 am Last dose of injectable or oral agent 09/12/17 am Ostomy pouch emptied and vented if applicable NA All implantable devices assessed, documented and approved NA Intravenous access site secured and place NA Valuables secured Linens and cotton and cotton/polyester blend (less than 51% polyester) Personal oil-based products / skin lotions / body lotions removed Wigs or hairpieces removed NA Smoking or tobacco materials removed NA Books / newspapers / magazines / loose paper removed Cologne, aftershave, perfume and deodorant removed Jewelry removed (may wrap wedding band) Make-up removed Hair care products removed Battery operated devices (external) removed Heating patches and chemical warmers removed Titanium eyewear removed NA Nail polish cured greater than 10 hours Casting material  cured greater than 10 hours NA Hearing aids removed NA Loose dentures or partials removed NA Prosthetics have been removed NA Patient demonstrates correct use of air break device (if applicable) Patient concerns have been addressed Patient grounding bracelet on and cord attached to chamber Specifics for Inpatients (complete in addition to above) Medication sheet  sent with patient Intravenous medications needed or due during therapy sent with patient Drainage tubes (e.g. nasogastric tube or chest tube secured and vented) Endotracheal or Tracheotomy tube secured Cuff deflated of air and inflated with saline Airway suctioned Electronic Signature(s) Signed: 09/12/2017 4:41:22 PM By: Dayton Martes RCP, RRT, CHT Shari Turner, [name] ([id number]) Entered By: Dayton Martes on 09/12/2017 09:17:30

## 2017-09-14 NOTE — Progress Notes (Signed)
Shari Turner, Shari Turner ([id number]) Visit Report for 09/13/2017 HBO Details Patient Name: Shari Turner, Shari Turner Date of Service: 09/13/2017 10:00 AM Medical Record Number: [id number] Patient Account Number: [id number] Date of Birth/Sex: 1941/08/07 (76 y.o. 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 HBO Treatment Details Treatment Number: 8 Patient Type: Outpatient Chamber Type: Monoplace Chamber Serial #: [id number] Treatment Protocol: 2.0 ATA with 90 minutes oxygen, and no air breaks Treatment Details Compression Rate Down: 1.5 psi / minute De-Compression Rate Up: 1.5 psi / minute Compress Tx Pressure Air breaks and breathing periods Decompress Decompress Begins Reached (leave unused spaces blank) Begins Ends Chamber Pressure (ATA) 1 2 - - - - - - 2 1 Clock Time (24 hr) 09:36 09:46 - - - - - - 11:16 11:26 Treatment Length: 110 (minutes) Treatment Segments: 4 Capillary Blood Glucose Pre Capillary Blood Glucose (mg/dl): Post Capillary Blood Glucose (mg/dl): Vital Signs Capillary Blood Glucose Reference Range: 80 - 120 mg / dl HBO Diabetic Blood Glucose Intervention Range: <131 mg/dl or >782 mg/dl Time Vitals Blood Respiratory Capillary Blood Glucose Pulse Action Type: Pulse: Temperature: Taken: Pressure: Rate: Glucose (mg/dl): Meter #: Oximetry (%) Taken: Pre 09:16 112/60 72 16 98.8 209 1 none per protocol Post 11:32 118/58 78 16 98.5 250 1 none Treatment Response Treatment Completion Status: Treatment Completed without Adverse Event HBO Attestation I certify that I supervised this HBO treatment in accordance with Medicare  guidelines. A trained emergency response Yes team is readily available per hospital policies and procedures. Continue HBOT as ordered. Yes Electronic Signature(s) Shari Turner, Shari Turner ([id number]) Signed: 09/13/2017 12:06:44 PM By: [name] MD, FACS Previous Signature: 09/13/2017 11:48:50 AM Version By: Dayton Martes RCP, RRT, CHT Entered By: [name] on 09/13/2017 12:06:43 Shari Turner ([id number]) -------------------------------------------------------------------------------- HBO Safety Checklist Details Patient Name: Shari Turner Date of Service: 09/13/2017 10:00 AM Medical Record Number: [id number] Patient Account Number: [id number] Date of Birth/Sex: 30-Oct-1940 (76 y.o. Female) Treating RN: Primary Care Naasia Weilbacher: [name] Other Clinician: [name] Referring Tashawn Greff: [name] Treating Ihor Meinzer/Extender: Rudene Re in Treatment: 6 HBO Safety Checklist Items Safety Checklist Consent Form Signed Patient voided / foley secured and emptied When did you last eato 09/13/17 am Last dose of injectable or oral agent 09/13/17 am Ostomy pouch emptied and vented if applicable NA All implantable devices assessed, documented and approved NA Intravenous access site secured and place NA Valuables secured Linens and cotton and cotton/polyester blend (less than 51% polyester) Personal oil-based products / skin lotions / body lotions removed Wigs or hairpieces removed NA Smoking or tobacco materials removed NA Books / newspapers / magazines / loose paper removed Cologne, aftershave, perfume and deodorant removed Jewelry removed (may wrap wedding band) Make-up removed Hair care products removed Battery operated devices (external) removed Heating patches and chemical warmers removed Titanium eyewear removed NA Nail polish cured greater than 10 hours Casting material cured greater than 10 hours NA Hearing aids removed NA Loose  dentures or partials removed NA Prosthetics have been removed NA Patient demonstrates correct use of air break device (if applicable) Patient concerns have been addressed Patient grounding bracelet on and cord attached to chamber Specifics for Inpatients (complete in addition to above) Medication sheet sent with patient Intravenous medications needed or due during therapy  sent with patient Drainage tubes (e.g. nasogastric tube or chest tube secured and vented) Endotracheal or Tracheotomy tube secured Cuff deflated of air and inflated with saline Airway suctioned Electronic Signature(s) Signed: 09/13/2017 11:48:50 AM By: Dayton Martes RCP, RRT, CHT [place], Shari Turner ([id number]) Entered By: Dayton Martes on 09/13/2017 09:22:03

## 2017-09-14 NOTE — Progress Notes (Signed)
Shari Turner, Shari Turner ([id number]) Visit Report for 09/13/2017 Arrival Information Details Patient Name: Shari Turner, Shari Turner Date of Service: 09/13/2017 10:00 AM Medical Record Number: [id number] Patient Account Number: [id number] Date of Birth/Sex: [birth year] (76 y.o. Female) Treating RN: Primary Care Paulette Lynch: Gershon Crane Other Clinician: Jacqulyn Bath Referring Aeneas Longsworth: Gershon Crane Treating Magaby Rumberger/Extender: Frann Rider in Treatment: 6 Visit Information History Since Last Visit Added or deleted any medications: No Patient Arrived: Wheel Chair Any new allergies or adverse reactions: No Arrival Time: 09:15 Had a fall or experienced change in No Accompanied By: granddaughter, Arby Barrette activities of daily living that may affect Transfer Assistance: Manual risk of falls: Patient Identification Verified: Yes Signs or symptoms of abuse/neglect since last visito No Secondary Verification Process Yes Hospitalized since last visit: No Completed: Pain Present Now: No Patient Has Alerts: Yes Patient Alerts: Patient on Blood Thinner DMII Eliquis Electronic Signature(s) Signed: 09/13/2017 11:48:50 AM By: Lorine Bears RCP, RRT, CHT Entered By: Lorine Bears on 09/13/2017 09:20:51 Shari Turner ([id number]) -------------------------------------------------------------------------------- Encounter Discharge Information Details Patient Name: Shari Turner Date of Service: 09/13/2017 10:00 AM Medical Record Number: [id number] Patient Account Number: [id number] Date of Birth/Sex: 05-Apr-1941 (76 y.o. Female) Treating RN: Primary Care Frans Valente: Gershon Crane Other Clinician: Jacqulyn Bath Referring Felix Pratt: Gershon Crane Treating Earl Zellmer/Extender: Frann Rider in Treatment: 6 Encounter Discharge Information Items Discharge Pain Level: 0 Discharge Condition: Stable Ambulatory Status: Wheelchair Discharge Destination:  Home Transportation: Private Auto granddaughter, Accompanied By: Arby Barrette Schedule Follow-up Appointment: No Medication Reconciliation completed and No provided to Patient/Care Jaray Boliver: Clinical Summary of Care: Electronic Signature(s) Signed: 09/13/2017 11:48:50 AM By: Lorine Bears RCP, RRT, CHT Entered By: Lorine Bears on 09/13/2017 11:48:30 Shari Turner ([id number]) -------------------------------------------------------------------------------- Vitals Details Patient Name: Shari Turner Date of Service: 09/13/2017 10:00 AM Medical Record Number: [id number] Patient Account Number: [id number] Date of Birth/Sex: February 05, 1941 (76 y.o. Female) Treating RN: Primary Care Mackenzye Mackel: Gershon Crane Other Clinician: Jacqulyn Bath Referring Taige Housman: Gershon Crane Treating Adylin Hankey/Extender: Frann Rider in Treatment: 6 Vital Signs Time Taken: 09:16 Temperature (F): 98.8 Height (in): 62 Pulse (bpm): 72 Weight (lbs): 148 Respiratory Rate (breaths/min): 16 Body Mass Index (BMI): 27.1 Blood Pressure (mmHg): 112/60 Capillary Blood Glucose (mg/dl): 209 Reference Range: 80 - 120 mg / dl Electronic Signature(s) Signed: 09/13/2017 11:48:50 AM By: Lorine Bears RCP, RRT, CHT Entered By: Becky Sax, Amado Nash on 09/13/2017 09:21:14

## 2017-09-17 ENCOUNTER — Encounter: Payer: Medicare Other | Admitting: Surgery

## 2017-09-18 ENCOUNTER — Encounter: Payer: Medicare Other | Admitting: Internal Medicine

## 2017-09-18 NOTE — Progress Notes (Signed)
Shari, Turner ([id number]) Visit Report for 09/14/2017 HBO Details Patient Name: Shari Turner, Shari Turner Date of Service: 09/14/2017 10:00 AM Medical Record Number: [id number] Patient Account Number: [id number] Date of Birth/Sex: 06-01-1941 (76 y.o. 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 HBO Treatment Details Treatment Number: 9 Patient Type: Outpatient Chamber Type: Monoplace Chamber Serial #: [id number] Treatment Protocol: 2.0 ATA with 90 minutes oxygen, and no air breaks Treatment Details Compression Rate Down: 1.5 psi / minute De-Compression Rate Up: 1.5 psi / minute Compress Tx Pressure Air breaks and breathing periods Decompress Decompress Begins Reached (leave unused spaces blank) Begins Ends Chamber Pressure (ATA) 1 2 - - - - - - 2 1 Clock Time (24 hr) 09:28 09:38 - - - - - - 11:08 11:18 Treatment Length: 110 (minutes) Treatment Segments: 4 Capillary Blood Glucose Pre Capillary Blood Glucose (mg/dl): Post Capillary Blood Glucose (mg/dl): Vital Signs Capillary Blood Glucose Reference Range: 80 - 120 mg / dl HBO Diabetic Blood Glucose Intervention Range: <131 mg/dl or >329 mg/dl Time Vitals Blood Respiratory Capillary Blood Glucose Pulse Action Type: Pulse: Temperature: Taken: Pressure: Rate: Glucose (mg/dl): Meter #: Oximetry (%) Taken: Pre 09:06 120/56 72 16 98.3 210 1 none per protocol Post 11:23 118/56 66 16 98.5 204 1 none per protocol Treatment Response Treatment Completion Status: Treatment Completed without Adverse Event HBO Attestation I certify that I supervised this HBO treatment in  accordance with Medicare guidelines. A trained emergency response Yes team is readily available per hospital policies and procedures. Continue HBOT as ordered. Yes Electronic Signature(s) TARANA, BOCANEGRA ([id number]) Signed: 09/14/2017 11:59:36 AM By: [name] MD, FACS Previous Signature: 09/14/2017 11:58:28 AM Version By: Dayton Martes RCP, RRT, CHT Entered By: [name] on 09/14/2017 11:59:36 Shari Turner ([id number]) -------------------------------------------------------------------------------- HBO Safety Checklist Details Patient Name: Shari Turner Date of Service: 09/14/2017 10:00 AM Medical Record Number: [id number] Patient Account Number: [id number] Date of Birth/Sex: Dec 01, 1940 (76 y.o. Female) Treating RN: Primary Care Khristie Sak: [name] Other Clinician: [name] Referring Arlow Spiers: [name] Treating Jyquan Kenley/Extender: Rudene Re in Treatment: 7 HBO Safety Checklist Items Safety Checklist Consent Form Signed Patient voided / foley secured and emptied When did you last eato 09/14/17 am Last dose of injectable or oral agent 09/14/17 am Ostomy pouch emptied and vented if applicable NA All implantable devices assessed, documented and approved NA Intravenous access site secured and place NA Valuables secured Linens and cotton and cotton/polyester blend (less than 51% polyester) Personal oil-based products / skin lotions / body lotions removed Wigs or hairpieces removed NA Smoking or tobacco materials removed NA Books / newspapers / magazines / loose paper removed Cologne, aftershave, perfume and deodorant removed Jewelry removed (may wrap wedding band) Make-up removed Hair care products removed Battery operated devices (external) removed Heating patches and chemical warmers removed Titanium eyewear removed NA Nail polish cured greater than 10 hours Casting material cured greater than 10 hours NA Hearing  aids removed NA Loose dentures or partials removed NA Prosthetics have been removed NA Patient demonstrates correct use of air break device (if applicable) Patient concerns have been addressed Patient grounding bracelet on and cord attached to chamber Specifics for Inpatients (complete in addition to above) Medication sheet sent with patient Intravenous medications needed or due  during therapy sent with patient Drainage tubes (e.g. nasogastric tube or chest tube secured and vented) Endotracheal or Tracheotomy tube secured Cuff deflated of air and inflated with saline Airway suctioned Electronic Signature(s) Signed: 09/14/2017 11:58:28 AM By: Dayton Martes RCP, RRT, CHT [place], [name] ([id number]) Entered By: Dayton Martes on 09/14/2017 09:20:30

## 2017-09-18 NOTE — Progress Notes (Signed)
JEMIMA, PETKO ([id number]) Visit Report for 09/14/2017 Arrival Information Details Patient Name: Shari Turner, Shari Turner Date of Service: 09/14/2017 10:00 AM Medical Record Number: [id number] Patient Account Number: [id number] Date of Birth/Sex: 1941/08/04 (76 y.o. Female) Treating RN: Primary Care Caniya Tagle: Gershon Crane Other Clinician: Jacqulyn Bath Referring Kairi Harshbarger: Gershon Crane Treating Jia Dottavio/Extender: Frann Rider in Treatment: 7 Visit Information History Since Last Visit Added or deleted any medications: No Patient Arrived: Wheel Chair Any new allergies or adverse reactions: No Arrival Time: 09:05 Had a fall or experienced change in No Accompanied By: granddaughter, Arby Barrette activities of daily living that may affect Transfer Assistance: Manual risk of falls: Patient Identification Verified: Yes Signs or symptoms of abuse/neglect since last visito No Secondary Verification Process Yes Hospitalized since last visit: No Completed: Pain Present Now: No Patient Has Alerts: Yes Patient Alerts: Patient on Blood Thinner DMII Eliquis Electronic Signature(s) Signed: 09/14/2017 11:58:28 AM By: Lorine Bears RCP, RRT, CHT Entered By: Lorine Bears on 09/14/2017 09:19:01 Shari Turner ([id number]) -------------------------------------------------------------------------------- Encounter Discharge Information Details Patient Name: Shari Turner Date of Service: 09/14/2017 10:00 AM Medical Record Number: [id number] Patient Account Number: [id number] Date of Birth/Sex: 21-Aug-1941 (76 y.o. Female) Treating RN: Primary Care Legna Mausolf: Gershon Crane Other Clinician: Jacqulyn Bath Referring Julane Crock: Gershon Crane Treating Kammy Klett/Extender: Frann Rider in Treatment: 7 Encounter Discharge Information Items Discharge Pain Level: 0 Discharge Condition: Stable Ambulatory Status: Wheelchair Discharge Destination:  Home Transportation: Private Auto granddaughter, Accompanied By: Arby Barrette Schedule Follow-up Appointment: No Medication Reconciliation completed and No provided to Patient/Care Shubh Chiara: Clinical Summary of Care: Notes Patient has an HBO treatment scheduled on 09/17/17 at 10:00 am. Electronic Signature(s) Signed: 09/14/2017 11:58:28 AM By: Lorine Bears RCP, RRT, CHT Entered By: Lorine Bears on 09/14/2017 11:58:09 Shari Turner ([id number]) -------------------------------------------------------------------------------- Vitals Details Patient Name: Shari Turner Date of Service: 09/14/2017 10:00 AM Medical Record Number: [id number] Patient Account Number: [id number] Date of Birth/Sex: 1941-06-28 (76 y.o. Female) Treating RN: Primary Care Alec Mcphee: Gershon Crane Other Clinician: Jacqulyn Bath Referring [name]: Gershon Crane Treating Sergey Ishler/Extender: Frann Rider in Treatment: 7 Vital Signs Time Taken: 09:06 Temperature (F): 98.3 Height (in): 62 Pulse (bpm): 72 Weight (lbs): 148 Respiratory Rate (breaths/min): 16 Body Mass Index (BMI): 27.1 Blood Pressure (mmHg): 120/56 Capillary Blood Glucose (mg/dl): 210 Reference Range: 80 - 120 mg / dl Electronic Signature(s) Signed: 09/14/2017 11:58:28 AM By: Lorine Bears RCP, RRT, CHT Entered By: Lorine Bears on 09/14/2017 09:19:45

## 2017-09-19 ENCOUNTER — Encounter: Payer: Medicare Other | Admitting: Internal Medicine

## 2017-09-19 DIAGNOSIS — E11622 Type 2 diabetes mellitus with other skin ulcer: Secondary | ICD-10-CM | POA: Diagnosis not present

## 2017-09-19 LAB — GLUCOSE, CAPILLARY
GLUCOSE-CAPILLARY: 229 mg/dL — AB (ref 65–99)
GLUCOSE-CAPILLARY: 159 mg/dL — AB (ref 65–99)

## 2017-09-20 ENCOUNTER — Encounter: Payer: Medicare Other | Admitting: Surgery

## 2017-09-20 DIAGNOSIS — E11622 Type 2 diabetes mellitus with other skin ulcer: Secondary | ICD-10-CM | POA: Diagnosis not present

## 2017-09-20 LAB — GLUCOSE, CAPILLARY
GLUCOSE-CAPILLARY: 178 mg/dL — AB (ref 65–99)
GLUCOSE-CAPILLARY: 188 mg/dL — AB (ref 65–99)

## 2017-09-21 ENCOUNTER — Encounter: Payer: Medicare Other | Admitting: Surgery

## 2017-09-21 DIAGNOSIS — E11622 Type 2 diabetes mellitus with other skin ulcer: Secondary | ICD-10-CM | POA: Diagnosis not present

## 2017-09-21 LAB — GLUCOSE, CAPILLARY
GLUCOSE-CAPILLARY: 160 mg/dL — AB (ref 65–99)
Glucose-Capillary: 188 mg/dL — ABNORMAL HIGH (ref 65–99)

## 2017-09-21 NOTE — Progress Notes (Signed)
Shari Turner, Shari Turner ([id number]) Visit Report for 09/19/2017 Arrival Information Details Patient Name: Shari Turner, Shari Turner Date of Service: 09/19/2017 10:00 AM Medical Record Number: [id number] Patient Account Number: [id number] Date of Birth/Sex: 1941-04-16 (76 y.o. Female) Treating RN: Cornell Barman Primary Care Shari Turner: Gershon Crane Other Clinician: Jacqulyn Bath Referring Marquise Wicke: Gershon Crane Treating Suzane Vanderweide/Extender: Tito Dine in Treatment: 7 Visit Information History Since Last Visit Added or deleted any medications: No Patient Arrived: Wheel Chair Any new allergies or adverse reactions: No Arrival Time: 11:30 Had a fall or experienced change in No Accompanied By: granddaughter activities of daily living that may affect Transfer Assistance: None risk of falls: Patient Identification Verified: Yes Signs or symptoms of abuse/neglect since last visito No Secondary Verification Process Yes Hospitalized since last visit: No Completed: Pain Present Now: No Patient Has Alerts: Yes Patient Alerts: Patient on Blood Thinner DMII Eliquis Electronic Signature(s) Signed: 09/19/2017 2:02:10 PM By: Gretta Cool, [name], RN, CWS, [name], [name] Entered By: Gretta Cool, [name], RN, CWS, Kim on 09/19/2017 11:54:48 Shari Turner ([id number]) -------------------------------------------------------------------------------- Encounter Discharge Information Details Patient Name: Shari Turner Date of Service: 09/19/2017 10:00 AM Medical Record Number: [id number] Patient Account Number: [id number] Date of Birth/Sex: 03-30-1941 (76 y.o. Female) Treating RN: Cornell Barman Primary Care Taneil Lazarus: Gershon Crane Other Clinician: Jacqulyn Bath Referring Matan Steen: Gershon Crane Treating Claudio Mondry/Extender: Tito Dine in Treatment: 7 Encounter Discharge Information Items Discharge Pain Level: 0 Discharge Condition: Stable Ambulatory Status: Wheelchair Discharge Destination:  Home Transportation: Private Auto Accompanied By: Granddaughter Schedule Follow-up Appointment: Yes Medication Reconciliation completed and Yes provided to Patient/Care Kha Hari: Clinical Summary of Care: Electronic Signature(s) Signed: 09/19/2017 2:59:08 PM By: Gretta Cool, [name], RN, CWS, [name], [name] Entered By: Gretta Cool, [name], RN, CWS, Kim on 09/19/2017 14:59:08 Shari Turner ([id number]) -------------------------------------------------------------------------------- Patient/Caregiver Education Details Patient Name: Shari Turner Date of Service: 09/19/2017 10:00 AM Medical Record Number: [id number] Patient Account Number: [id number] Date of Birth/Gender: [birth year] (76 y.o. Female) Treating RN: Cornell Barman Primary Care Physician: Gershon Crane Other Clinician: Jacqulyn Bath Referring Physician: Gershon Crane Treating Physician/Extender: Tito Dine in Treatment: 7 Education Assessment Education Provided To: Patient Education Topics Provided Elevated Blood Sugar/ Impact on Healing: Handouts: Elevated Blood Sugars: How Do They Affect Wound Healing Methods: Explain/Verbal Responses: State content correctly Hyperbaric Oxygenation: Handouts: Hyperbaric Oxygen Methods: Demonstration, Explain/Verbal Responses: State content correctly Electronic Signature(s) Signed: 09/19/2017 4:30:20 PM By: Gretta Cool, [name], RN, CWS, [name], [name] Entered By: Gretta Cool, [name], RN, CWS, Kim on 09/19/2017 14:58:48 Shari Turner ([id number]) -------------------------------------------------------------------------------- Vitals Details Patient Name: Shari Turner Date of Service: 09/19/2017 10:00 AM Medical Record Number: [id number] Patient Account Number: [id number] Date of Birth/Sex: Jan 30, 1941 (76 y.o. Female) Treating RN: Cornell Barman Primary Care Sueellen Kayes: Gershon Crane Other Clinician: Jacqulyn Bath Referring Ashland Osmer: Gershon Crane Treating Lilit Cinelli/Extender: Tito Dine in Treatment: 7 Vital Signs Time Taken: 11:30 Temperature (F): 98.5 Height (in): 62 Pulse (bpm): 72 Weight (lbs): 148 Respiratory Rate (breaths/min): 16 Body Mass Index (BMI): 27.1 Blood Pressure (mmHg): 112/52 Capillary Blood Glucose (mg/dl): 229 Reference Range: 80 - 120 mg / dl Electronic Signature(s) Signed: 09/19/2017 2:02:10 PM By: Gretta Cool, [name], RN, CWS, [name], [name] Entered By: Gretta Cool, [name], RN, CWS, Kim on 09/19/2017 11:55:22

## 2017-09-22 NOTE — Progress Notes (Signed)
ALEANNA, MENGE ([id number]) Visit Report for 09/21/2017 Arrival Information Details Patient Name: Shari Turner, Shari Turner Date of Service: 09/21/2017 10:00 AM Medical Record Number: [id number] Patient Account Number: [id number] Date of Birth/Sex: Jul 09, 1941 (76 y.o. Female) Treating RN: Primary Care Kedra Mcglade: Gershon Crane Other Clinician: Jacqulyn Bath Referring Nikiesha Milford: Gershon Crane Treating Yulian Gosney/Extender: Frann Rider in Treatment: 8 Visit Information History Since Last Visit Added or deleted any medications: No Patient Arrived: Wheel Chair Any new allergies or adverse reactions: No Arrival Time: 09:15 Had a fall or experienced change in No Accompanied By: granddaughter, Arby Barrette activities of daily living that may affect Transfer Assistance: Manual risk of falls: Patient Identification Verified: Yes Signs or symptoms of abuse/neglect since last visito No Secondary Verification Process Yes Hospitalized since last visit: No Completed: Pain Present Now: No Patient Has Alerts: Yes Patient Alerts: Patient on Blood Thinner DMII Eliquis Electronic Signature(s) Signed: 09/21/2017 11:49:08 AM By: Lorine Bears RCP, RRT, CHT Entered By: Lorine Bears on 09/21/2017 09:23:43 Shari Turner ([id number]) -------------------------------------------------------------------------------- Encounter Discharge Information Details Patient Name: Shari Turner Date of Service: 09/21/2017 10:00 AM Medical Record Number: [id number] Patient Account Number: [id number] Date of Birth/Sex: 1941-02-17 (76 y.o. Female) Treating RN: Primary Care Bishop Vanderwerf: Gershon Crane Other Clinician: Jacqulyn Bath Referring Shanikia Kernodle: Gershon Crane Treating Coumba Kellison/Extender: Frann Rider in Treatment: 8 Encounter Discharge Information Items Discharge Pain Level: 0 Discharge Condition: Stable Ambulatory Status: Wheelchair Discharge Destination:  Home Transportation: Private Auto granddaughter, Accompanied By: Arby Barrette Schedule Follow-up Appointment: No Medication Reconciliation completed and No provided to Patient/Care Bradden Tadros: Clinical Summary of Care: Notes Patient has an HBO treatment scheduled on 09/24/17 at 10:00 am. Electronic Signature(s) Signed: 09/21/2017 11:49:08 AM By: Lorine Bears RCP, RRT, CHT Entered By: Lorine Bears on 09/21/2017 11:48:51 Shari Turner ([id number]) -------------------------------------------------------------------------------- Vitals Details Patient Name: Shari Turner Date of Service: 09/21/2017 10:00 AM Medical Record Number: [id number] Patient Account Number: [id number] Date of Birth/Sex: 01/19/1941 (76 y.o. Female) Treating RN: Primary Care Aulani Shipton: Gershon Crane Other Clinician: Jacqulyn Bath Referring Crystallee Werden: Gershon Crane Treating Acadia Thammavong/Extender: Frann Rider in Treatment: 8 Vital Signs Time Taken: 09:16 Temperature (F): 98.6 Height (in): 62 Pulse (bpm): 66 Weight (lbs): 148 Respiratory Rate (breaths/min): 16 Body Mass Index (BMI): 27.1 Blood Pressure (mmHg): 122/58 Capillary Blood Glucose (mg/dl): 188 Reference Range: 80 - 120 mg / dl Electronic Signature(s) Signed: 09/21/2017 11:49:08 AM By: Lorine Bears RCP, RRT, CHT Entered By: Lorine Bears on 09/21/2017 09:24:06

## 2017-09-22 NOTE — Progress Notes (Signed)
Shari Turner, Shari Turner ([id number]) Visit Report for 09/19/2017 Physician Orders Details Patient Name: Shari Turner, Shari Turner Date of Service: 09/19/2017 10:00 AM Medical Record Number: [id number] Patient Account Number: [id number] Date of Birth/Sex: [birth year] (76 y.o. Female) Treating RN: Cornell Barman Primary Care Provider: Gershon Crane Other Clinician: Jacqulyn Bath Referring Provider: Gershon Crane Treating Provider/Extender: Tito Dine in Treatment: 7 Verbal / Phone Orders: No Diagnosis Coding Hyperbaric Oxygen Therapy o Indication: - Chronic Refractory Osteomyelitis o If appropriate for treatment, begin HBOT per protocol: o 2.0 ATA for 90 Minutes without Air Breaks o One treatment per day (delivered Monday through Friday unless otherwise specified in Special Instructions below): o Total # of Treatments: - 40 o Finger stick Blood Glucose Pre- and Post- HBOT Treatment. o Follow Hyperbaric Oxygen Glycemia Protocol GLYCEMIA INTERVENTIONS PROTOCOL PRE-HBO GLYCEMIA INTERVENTIONS ACTION INTERVENTION Obtain pre-HBO capillary blood glucose 1 (ensure physician order is in chart). A. Notify HBO physician and await physician orders. 2 If result is 70 mg/dl or below: B. If the result meets the hospital definition of a critical result, follow hospital policy. A. Give patient an 8 ounce Glucerna Shake, an 8 ounce Ensure, or 8 ounces of a Glucerna/Ensure equivalent dietary supplement*. B. Wait 30 minutes. C. Retest patientos capillary blood If result is 71 mg/dl to 130 mg/dl: glucose (CBG). D. If result greater than or equal to 110 mg/dl, proceed with HBO. If result less than 110 mg/dl, notify HBO physician and consider holding HBO. If result is 131 mg/dl to 249 mg/dl: A. Proceed with HBO. A. Notify HBO physician and await physician orders. B. It is recommended to hold HBO and If result is 250 mg/dl or greater: do blood/urine ketone testing. C. If the  result meets the hospital definition of a critical result, follow hospital policy. POST-HBO GLYCEMIA INTERVENTIONS ACTION INTERVENTION Shari Turner, Shari Turner ([id number]) 1 Obtain post HBO capillary blood glucose (ensure physician order is in chart). A. Notify HBO physician and await physician orders. 2 If result is 70 mg/dl or below: B. If the result meets the hospital definition of a critical result, follow hospital policy. A. Give patient an 8 ounce Glucerna Shake, an 8 ounce Ensure, or 8 ounces of a Glucerna/Ensure equivalent dietary supplement*. B. Wait 15 minutes for symptoms of hypoglycemia (i.e. nervousness, anxiety, sweating, chills, If result is 71 mg/dl to 100 mg/dl: clamminess, irritability, confusion, tachycardia or dizziness). C. If patient asymptomatic, discharge patient. If patient symptomatic, repeat capillary blood glucose (CBG) and notify HBO physician. If result is 101 mg/dl to 249 mg/dl: A. Discharge patient. A. Notify HBO physician and await physician orders. B. It is recommended to do blood/urine If result is 250 mg/dl or greater: ketone testing. C. If the result meets the hospital definition of a critical result, follow hospital policy. *Juice or candies are NOT equivalent products. If patient refuses the Glucerna or Ensure, please consult the hospital dietitian for an appropriate substitute. Electronic Signature(s) Signed: 09/19/2017 2:58:22 PM By: Gretta Cool, [name], RN, CWS, [name], [name] Signed: 09/19/2017 5:01:55 PM By: Linton Ham MD Entered By: Gretta Cool, [name], RN, CWS, Kim on 09/19/2017 14:58:21 Shari Turner ([id number]) -------------------------------------------------------------------------------- [place] Details Patient Name: Shari Turner Date of Service: 09/19/2017 Medical Record Number: [id number] Patient Account Number: [id number] Date of Birth/Sex: 08/13/1941 (76 y.o. 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, unspecified Facility Procedures CPT4 Code: 57322025 Description: (Facility Use Only) HBOT, full body chamber, 72min Modifier: Quantity: 4 Physician Procedures CPT4 Code Description: 4270623 [id number] - WC PHYS HYPERBARIC OXYGEN THERAPY ICD-10 Diagnosis Description 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

## 2017-09-22 NOTE — Progress Notes (Signed)
LEXIUS, BEX ([id number]) Visit Report for 09/19/2017 HBO Details Patient Name: Shari Turner, Shari Turner Date of Service: 09/19/2017 10:00 AM Medical Record Number: [id number] Patient Account Number: [id number] Date of Birth/Sex: [birth year] (76 y.o. Female) Treating RN: [name] Primary Care Edia Pursifull: [name] Other Clinician: [name] Referring Azarion Hove: [name] Treating Solita Macadam/Extender: Altamese [place] in Treatment: 7 HBO Treatment Course Details Treatment Course Number: 1 Ordering Keandria Berrocal: [name] Total Treatments Ordered: 40 HBO Treatment Start Date: 09/03/2017 HBO Indication: Chronic Refractory Osteomyelitis to Pelvis HBO Treatment Details Treatment Number: 10 Patient Type: Outpatient Chamber Type: Monoplace Chamber Serial #: [id number] Treatment Protocol: 2.0 ATA with 90 minutes oxygen, and no air breaks Treatment Details Compression Rate Down: 1.5 psi / minute De-Compression Rate Up: 1.5 psi / minute Compress Tx Pressure Air breaks and breathing periods Decompress Decompress Begins Reached (leave unused spaces blank) Begins Ends Chamber Pressure (ATA) 1 2 - - - - - - 2 1 Clock Time (24 hr) 11:53 12:03 - - - - - - 13:35 13:46 Treatment Length: 113 (minutes) Treatment Segments: 4 Capillary Blood Glucose Pre Capillary Blood Glucose (mg/dl): Post Capillary Blood Glucose (mg/dl): Vital Signs Capillary Blood Glucose Reference Range: 80 - 120 mg / dl HBO Diabetic Blood Glucose Intervention Range: <131 mg/dl or >782 mg/dl Time Vitals Blood Respiratory Capillary Blood Glucose Pulse Action Type: Pulse: Temperature: Taken: Pressure: Rate: Glucose (mg/dl): Meter #: Oximetry (%) Taken: Pre 11:30 112/52 72 16 98.5 229 Post 14:56 124/62 78 16 97.8 159 Treatment Response Treatment Toleration: Well Treatment Completion Treatment Completed without Adverse Event Status: Shari Turner Notes no concerns with treatment given HBO Attestation I  certify that I supervised this HBO treatment in accordance with Medicare guidelines. A trained emergency response Yes team is readily available per hospital policies and procedures. Shari Turner ([id number]) Continue HBOT as ordered. Yes Electronic Signature(s) Signed: 09/19/2017 5:01:55 PM By: Shari Najjar MD Previous Signature: 09/19/2017 2:57:08 PM Version By: [name] [name], RN, CWS, [name], [name] Previous Signature: 09/19/2017 2:02:10 PM Version By: [name] [name], RN, CWS, [name], [name] Entered By: Shari Turner on 09/19/2017 17:01:20 Shari Turner ([id number]) -------------------------------------------------------------------------------- HBO Safety Checklist Details Patient Name: Shari Turner Date of Service: 09/19/2017 10:00 AM Medical Record Number: [id number] Patient Account Number: [id number] Date of Birth/Sex: 1941/04/25 (76 y.o. Female) Treating RN: [name] Primary Care Kelven Flater: [name] Other Clinician: [name] Referring Davius Goudeau: [name] Treating Jerell Demery/Extender: Altamese [place] in Treatment: 7 HBO Safety Checklist Items Safety Checklist Consent Form Signed Patient voided / foley secured and emptied When did you last eato breakfast this morning Last dose of injectable or oral agent am Ostomy pouch emptied and vented if applicable NA All implantable devices assessed, documented and approved NA Intravenous access site secured and place NA Valuables secured NA Linens and cotton and cotton/polyester blend (less than 51% polyester) Personal oil-based products / skin lotions / body lotions removed NA Wigs or hairpieces removed NA Smoking or tobacco materials removed NA Books / newspapers / magazines / loose paper removed Cologne, aftershave, perfume and deodorant removed Jewelry removed (may wrap wedding band) Make-up removed Hair care products removed Battery operated devices (external) removed NA Heating patches and  chemical warmers removed NA Titanium eyewear removed NA Nail polish cured greater than 10 hours Casting material cured greater than 10 hours NA Hearing aids removed NA Loose dentures or partials removed NA Prosthetics have been removed NA Patient demonstrates correct use of air break device (if applicable) Patient concerns have been addressed Patient  grounding bracelet on and cord attached to chamber Specifics for Inpatients (complete in addition to above) Medication sheet sent with patient Intravenous medications needed or due during therapy sent with patient Drainage tubes (e.g. nasogastric tube or chest tube secured and vented) Endotracheal or Tracheotomy tube secured Cuff deflated of air and inflated with saline Airway suctioned Electronic Signature(s) Signed: 09/19/2017 2:02:10 PM By: [name], [name], RN, CWS, [name], [name] Shari Turner ([id number]) Entered By: [name], [name], RN, CWS, Kim on 09/19/2017 11:57:30

## 2017-09-22 NOTE — Progress Notes (Signed)
MERINA, BEHRENDT ([id number]) Visit Report for 09/20/2017 Arrival Information Details Patient Name: Shari Turner, Shari Turner Date of Service: 09/20/2017 10:00 AM Medical Record Number: [id number] Patient Account Number: [id number] Date of Birth/Sex: 12-03-1940 (76 y.o. Female) Treating RN: Primary Care Havanah Nelms: Gershon Crane Other Clinician: Jacqulyn Bath Referring Taleen Prosser: Gershon Crane Treating Lonzell Dorris/Extender: Frann Rider in Treatment: 7 Visit Information History Since Last Visit Added or deleted any medications: No Patient Arrived: Wheel Chair Any new allergies or adverse reactions: No Arrival Time: 08:50 Had a fall or experienced change in No Accompanied By: granddaughter, Arby Barrette activities of daily living that may affect Transfer Assistance: Manual risk of falls: Patient Identification Verified: Yes Signs or symptoms of abuse/neglect since last visito No Secondary Verification Process Yes Hospitalized since last visit: No Completed: Pain Present Now: No Patient Has Alerts: Yes Patient Alerts: Patient on Blood Thinner DMII Eliquis Electronic Signature(s) Signed: 09/20/2017 11:32:32 AM By: Lorine Bears RCP, RRT, CHT Entered By: Lorine Bears on 09/20/2017 09:40:01 Shari Turner ([id number]) -------------------------------------------------------------------------------- Encounter Discharge Information Details Patient Name: Shari Turner Date of Service: 09/20/2017 10:00 AM Medical Record Number: [id number] Patient Account Number: [id number] Date of Birth/Sex: 23-Feb-1941 (76 y.o. Female) Treating RN: Primary Care Nyjah Schwake: Gershon Crane Other Clinician: Jacqulyn Bath Referring Thurmon Mizell: Gershon Crane Treating Oriana Horiuchi/Extender: Frann Rider in Treatment: 7 Encounter Discharge Information Items Discharge Pain Level: 0 Discharge Condition: Stable Ambulatory Status: Wheelchair Discharge Destination:  Home Transportation: Private Auto granddaughter, Accompanied By: Arby Barrette Schedule Follow-up Appointment: No Medication Reconciliation completed and No provided to Patient/Care Graeden Bitner: Clinical Summary of Care: Notes Patient has an HBO treatment scheduled on 09/21/17 at 10:00 am. Electronic Signature(s) Signed: 09/20/2017 11:32:32 AM By: Lorine Bears RCP, RRT, CHT Entered By: Lorine Bears on 09/20/2017 11:32:16 Shari Turner ([id number]) -------------------------------------------------------------------------------- Vitals Details Patient Name: Shari Turner Date of Service: 09/20/2017 10:00 AM Medical Record Number: [id number] Patient Account Number: [id number] Date of Birth/Sex: [birth year] (76 y.o. Female) Treating RN: Primary Care Wilmar Prabhakar: Gershon Crane Other Clinician: Jacqulyn Bath Referring Ella Guillotte: Gershon Crane Treating Flordia Kassem/Extender: Frann Rider in Treatment: 7 Vital Signs Time Taken: 08:56 Temperature (F): 98.7 Height (in): 62 Pulse (bpm): 66 Weight (lbs): 148 Respiratory Rate (breaths/min): 16 Body Mass Index (BMI): 27.1 Blood Pressure (mmHg): 102/62 Capillary Blood Glucose (mg/dl): 178 Reference Range: 80 - 120 mg / dl Electronic Signature(s) Signed: 09/20/2017 11:32:32 AM By: Lorine Bears RCP, RRT, CHT Entered By: Lorine Bears on 09/20/2017 09:52:55

## 2017-09-22 NOTE — Progress Notes (Signed)
ATIYAH, HIGGENS ([id number]) Visit Report for 09/20/2017 HBO Details Patient Name: Shari Turner, Shari Turner Date of Service: 09/20/2017 10:00 AM Medical Record Number: [id number] Patient Account Number: [id number] Date of Birth/Sex: August 27, 1941 (76 y.o. 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 HBO Treatment Details Treatment Number: 12 Patient Type: Outpatient Chamber Type: Monoplace Chamber Serial #: [id number] Treatment Protocol: 2.0 ATA with 90 minutes oxygen, and no air breaks Treatment Details Compression Rate Down: 1.5 psi / minute De-Compression Rate Up: 1.5 psi / minute Compress Tx Pressure Air breaks and breathing periods Decompress Decompress Begins Reached (leave unused spaces blank) Begins Ends Chamber Pressure (ATA) 1 2 - - - - - - 2 1 Clock Time (24 hr) 09:18 09:28 - - - - - - 10:58 11:10 Treatment Length: 112 (minutes) Treatment Segments: 4 Capillary Blood Glucose Pre Capillary Blood Glucose (mg/dl): Post Capillary Blood Glucose (mg/dl): Vital Signs Capillary Blood Glucose Reference Range: 80 - 120 mg / dl HBO Diabetic Blood Glucose Intervention Range: <131 mg/dl or >259 mg/dl Time Vitals Blood Respiratory Capillary Blood Glucose Pulse Action Type: Pulse: Temperature: Taken: Pressure: Rate: Glucose (mg/dl): Meter #: Oximetry (%) Taken: Pre 08:56 102/62 66 16 98.7 178 1 none per protocol Post 11:18 112/62 72 16 98 188 1 none per protocol Treatment Response Treatment Completion Status: Treatment Completed without Adverse Event HBO Attestation I certify that I supervised this HBO treatment in  accordance with Medicare guidelines. A trained emergency response Yes team is readily available per hospital policies and procedures. Continue HBOT as ordered. Yes Electronic Signature(s) CHEN, MINSER ([id number]) Signed: 09/20/2017 2:23:22 PM By: [name] MD, FACS Previous Signature: 09/20/2017 11:32:32 AM Version By: Dayton Martes RCP, RRT, CHT Entered By: [name] on 09/20/2017 14:23:22 [name] ([id number]) -------------------------------------------------------------------------------- HBO Safety Checklist Details Patient Name: [name] Date of Service: 09/20/2017 10:00 AM Medical Record Number: [id number] Patient Account Number: [id number] Date of Birth/Sex: 1941-02-13 (76 y.o. Female) Treating RN: Primary Care Osmara Drummonds: [name] Other Clinician: [name] Referring Zakk Borgen: [name] Treating Jahne Krukowski/Extender: Rudene Re in Treatment: 7 HBO Safety Checklist Items Safety Checklist Consent Form Signed Patient voided / foley secured and emptied When did you last eato 09/20/17 am Last dose of injectable or oral agent 09/20/17 am Ostomy pouch emptied and vented if applicable NA All implantable devices assessed, documented and approved NA Intravenous access site secured and place NA Valuables secured Linens and cotton and cotton/polyester blend (less than 51% polyester) Personal oil-based products / skin lotions / body lotions removed Wigs or hairpieces removed NA Smoking or tobacco materials removed NA Books / newspapers / magazines / loose paper removed Cologne, aftershave, perfume and deodorant removed Jewelry removed (may wrap wedding band) Make-up removed Hair care products removed Battery operated devices (external) removed Heating patches and chemical warmers removed Titanium eyewear removed NA Nail polish cured greater than 10 hours Casting material cured greater than 10 hours NA Hearing  aids removed NA Loose dentures or partials removed NA Prosthetics have been removed NA Patient demonstrates correct use of air break device (if applicable) Patient concerns have been addressed Patient grounding bracelet on and cord attached to chamber Specifics for Inpatients (complete in addition to above) Medication sheet sent with patient Intravenous medications needed or due  during therapy sent with patient Drainage tubes (e.g. nasogastric tube or chest tube secured and vented) Endotracheal or Tracheotomy tube secured Cuff deflated of air and inflated with saline Airway suctioned Electronic Signature(s) Signed: 09/20/2017 11:32:32 AM By: Dayton Martes RCP, RRT, CHT [place], [name] ([id number]) Entered By: Dayton Martes on 09/20/2017 09:54:10

## 2017-09-22 NOTE — Progress Notes (Signed)
Shari Turner, Shari Turner ([id number]) Visit Report for 09/21/2017 HBO Details Patient Name: Shari Turner, Shari Turner Date of Service: 09/21/2017 10:00 AM Medical Record Number: [id number] Patient Account Number: [id number] Date of Birth/Sex: May 11, 1941 (76 y.o. 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 HBO Treatment Details Treatment Number: 13 Patient Type: Outpatient Chamber Type: Monoplace Chamber Serial #: [id number] Treatment Protocol: 2.0 ATA with 90 minutes oxygen, and no air breaks Treatment Details Compression Rate Down: 1.5 psi / minute De-Compression Rate Up: 1.5 psi / minute Compress Tx Pressure Air breaks and breathing periods Decompress Decompress Begins Reached (leave unused spaces blank) Begins Ends Chamber Pressure (ATA) 1 2 - - - - - - 2 1 Clock Time (24 hr) 09:38 09:48 - - - - - - 11:18 11:28 Treatment Length: 110 (minutes) Treatment Segments: 4 Capillary Blood Glucose Pre Capillary Blood Glucose (mg/dl): Post Capillary Blood Glucose (mg/dl): Vital Signs Capillary Blood Glucose Reference Range: 80 - 120 mg / dl HBO Diabetic Blood Glucose Intervention Range: <131 mg/dl or >782 mg/dl Time Vitals Blood Respiratory Capillary Blood Glucose Pulse Action Type: Pulse: Temperature: Taken: Pressure: Rate: Glucose (mg/dl): Meter #: Oximetry (%) Taken: Pre 09:16 122/58 66 16 98.6 188 1 none per protocol Post 11:33 112/66 72 16 97.8 160 1 none per protocol Treatment Response Treatment Completion Status: Treatment Completed without Adverse Event HBO Attestation I certify that I supervised this HBO treatment in  accordance with Medicare guidelines. A trained emergency response Yes team is readily available per hospital policies and procedures. Continue HBOT as ordered. Yes Electronic Signature(s) Shari Turner ([id number]) Signed: 09/21/2017 12:48:14 PM By: Shari Turner Previous Signature: 09/21/2017 11:49:08 AM Version By: Shari Turner Shari Turner Entered By: Shari Turner on 09/21/2017 12:48:14 Shari Turner ([id number]) -------------------------------------------------------------------------------- HBO Safety Checklist Details Patient Name: Shari Turner Date of Service: 09/21/2017 10:00 AM Medical Record Number: [id number] Patient Account Number: [id number] Date of Birth/Sex: 1940/12/16 (76 y.o. Female) Treating RN: Primary Care Shari Turner: Shari Turner Other Clinician: Izetta Turner Referring Shari Turner: Shari Turner Treating Shari Turner: Shari Turner in Treatment: 8 HBO Safety Checklist Items Safety Checklist Consent Form Signed Patient voided / foley secured and emptied When did you last eato 09/21/17 am Last dose of injectable or oral agent 09/21/17 am Ostomy pouch emptied and vented if applicable NA All implantable devices assessed, documented and approved NA Intravenous access site secured and place NA Valuables secured Linens and cotton and cotton/polyester blend (less than 51% polyester) Personal oil-based products / skin lotions / body lotions removed Wigs or hairpieces removed NA Smoking or tobacco materials removed NA Books / newspapers / magazines / loose paper removed Cologne, aftershave, perfume and deodorant removed Jewelry removed (may wrap wedding band) Make-up removed Hair care products removed Battery operated devices (external) removed Heating patches and chemical warmers removed Titanium eyewear removed NA Nail polish cured greater than 10 hours Casting material cured greater than 10  hours NA Hearing aids removed NA Loose dentures or partials removed NA Prosthetics have been removed NA Patient demonstrates correct use of air break device (if applicable) Patient concerns have been addressed Patient grounding bracelet on and cord attached to chamber Specifics for Inpatients (complete in addition to above) Medication sheet sent with patient Intravenous medications needed or due  during therapy sent with patient Drainage tubes (e.g. nasogastric tube or chest tube secured and vented) Endotracheal or Tracheotomy tube secured Cuff deflated of air and inflated with saline Airway suctioned Electronic Signature(s) Signed: 09/21/2017 11:49:08 AM By: Shari Turner Shari Turner Kidron, Shari Turner ([id number]) Entered By: Shari Turner on 09/21/2017 09:24:58

## 2017-09-22 NOTE — Progress Notes (Signed)
[name], [name] ([id number]) Visit Report for 09/20/2017 Problem List Details Patient Name: Shari Turner, Shari Turner Date of Service: 09/20/2017 10:00 AM Medical Record Number: [id number] Patient Account Number: [id number] Date of Birth/Sex: 1941/02/22 (76 y.o. 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, unspecified Facility Procedures CPT4 Code:  27253664 Description: (Facility Use Only) HBOT, full body chamber, 32min Modifier: Quantity: 4 Physician Procedures CPT4 Code: 4034742 Description: 59563 - WC PHYS HYPERBARIC OXYGEN THERAPY ICD-10 Diagnosis Description M86.68 Other chronic osteomyelitis, other site E11.622 Type 2 diabetes mellitus with other skin ulcer Modifier: Quantity: 1 Electronic Signature(s) Signed: 09/20/2017 2:23:37 PM By: Christin Fudge MD, FACS Previous Signature: 09/20/2017 11:32:32 AM Version By: Lorine Bears RCP, RRT, CHT Entered By: Christin Fudge on 09/20/2017 14:23:37

## 2017-09-24 ENCOUNTER — Encounter: Payer: Medicare Other | Admitting: Surgery

## 2017-09-24 DIAGNOSIS — E11622 Type 2 diabetes mellitus with other skin ulcer: Secondary | ICD-10-CM | POA: Diagnosis not present

## 2017-09-24 LAB — GLUCOSE, CAPILLARY
Glucose-Capillary: 203 mg/dL — ABNORMAL HIGH (ref 65–99)
Glucose-Capillary: 164 mg/dL — ABNORMAL HIGH (ref 65–99)

## 2017-09-24 NOTE — Progress Notes (Addendum)
BRENDAN, GRUWELL ([id number]) Visit Report for 09/24/2017 Arrival Information Details Patient Name: Shari, Turner Date of Service: 09/24/2017 10:00 AM Medical Record Number: [id number] Patient Account Number: [id number] Date of Birth/Sex: [birth year] (76 y.o. Female) Treating RN: Montey Hora Primary Care Muhammad Vacca: Gershon Crane Other Clinician: Referring Jake Goodson: Gershon Crane Treating Rheagan Nayak/Extender: Frann Rider in Treatment: 8 Visit Information History Since Last Visit Added or deleted any medications: No Patient Arrived: Wheel Chair Any new allergies or adverse reactions: No Arrival Time: 09:22 Had a fall or experienced change in No Accompanied By: granddaughter activities of daily living that may affect Transfer Assistance: Manual risk of falls: Patient Identification Verified: Yes Signs or symptoms of abuse/neglect since last visito No Secondary Verification Process Yes Hospitalized since last visit: No Completed: Pain Present Now: No Patient Has Alerts: Yes Patient Alerts: Patient on Blood Thinner DMII Eliquis Electronic Signature(s) Signed: 09/24/2017 10:18:45 AM By: Montey Hora Entered By: Montey Hora on 09/24/2017 10:18:45 Shari Turner ([id number]) -------------------------------------------------------------------------------- Encounter Discharge Information Details Patient Name: Shari Turner Date of Service: 09/24/2017 10:00 AM Medical Record Number: [id number] Patient Account Number: [id number] Date of Birth/Sex: Dec 21, 1940 (76 y.o. Female) Treating RN: Montey Hora Primary Care Jahiem Franzoni: Gershon Crane Other Clinician: Referring Elga Santy: Gershon Crane Treating Eddison Searls/Extender: Frann Rider in Treatment: 8 Encounter Discharge Information Items Discharge Pain Level: 0 Discharge Condition: Stable Ambulatory Status: Wheelchair Discharge Destination: Home Transportation: Private Auto Accompanied By:  granddaughter Schedule Follow-up Appointment: Yes Medication Reconciliation completed and No provided to Patient/Care Nimisha Rathel: Clinical Summary of Care: Notes Patient has an HBO treatment scheduled on 09/25/17 at 10:00 am. Electronic Signature(s) Signed: 09/24/2017 12:00:29 PM By: Montey Hora Entered By: Montey Hora on 09/24/2017 12:00:28 Shari Turner ([id number]) -------------------------------------------------------------------------------- Vitals Details Patient Name: Shari Turner Date of Service: 09/24/2017 10:00 AM Medical Record Number: [id number] Patient Account Number: [id number] Date of Birth/Sex: 11-Oct-1940 (76 y.o. Female) Treating RN: Montey Hora Primary Care Koda Routon: Gershon Crane Other Clinician: Referring Chelesea Weiand: Gershon Crane Treating Ova Meegan/Extender: Frann Rider in Treatment: 8 Vital Signs Time Taken: 09:30 Temperature (F): 97.9 Height (in): 62 Pulse (bpm): 92 Weight (lbs): 148 Respiratory Rate (breaths/min): 16 Body Mass Index (BMI): 27.1 Blood Pressure (mmHg): 114/58 Capillary Blood Glucose (mg/dl): 203 Reference Range: 80 - 120 mg / dl Electronic Signature(s) Signed: 09/24/2017 10:19:55 AM By: Montey Hora Entered By: Montey Hora on 09/24/2017 10:19:55

## 2017-09-24 NOTE — Progress Notes (Addendum)
JAMITA, DATA ([id number]) Visit Report for 09/24/2017 HBO Details Patient Name: Shari Turner, Shari Turner Date of Service: 09/24/2017 10:00 AM Medical Record Number: [id number] Patient Account Number: [id number] Date of Birth/Sex: [birth year] (76 y.o. 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 HBO Treatment Details Treatment Number: 14 Patient Type: Outpatient Chamber Type: Monoplace Chamber Serial #: [id number] Treatment Protocol: 2.0 ATA with 90 minutes oxygen, and no air breaks Treatment Details Compression Rate Down: 1.5 psi / minute De-Compression Rate Up: 1.5 psi / minute Compress Tx Pressure Air breaks and breathing periods Decompress Decompress Begins Reached (leave unused spaces blank) Begins Ends Chamber Pressure (ATA) 1 2 - - - - - - 2 1 Clock Time (24 hr) 09:53 10:03 - - - - - - 11:33 11:43 Treatment Length: 110 (minutes) Treatment Segments: 4 Capillary Blood Glucose Pre Capillary Blood Glucose (mg/dl): Post Capillary Blood Glucose (mg/dl): Vital Signs Capillary Blood Glucose Reference Range: 80 - 120 mg / dl HBO Diabetic Blood Glucose Intervention Range: <131 mg/dl or >782 mg/dl Time Vitals Blood Respiratory Capillary Blood Glucose Pulse Action Type: Pulse: Temperature: Taken: Pressure: Rate: Glucose (mg/dl): Meter #: Oximetry (%) Taken: Pre 09:30 114/58 92 16 97.9 203 Post 11:54 122/64 88 16 97.9 164 Treatment Response Treatment Completion Status: Treatment Completed without Adverse Event HBO Attestation I certify that I supervised this HBO treatment in accordance with Medicare guidelines. A trained  emergency response Yes team is readily available per hospital policies and procedures. Continue HBOT as ordered. Yes Electronic Signature(s) VANDI, GUARENTE ([id number]) Signed: 09/24/2017 12:02:00 PM By: [name] MD, FACS Previous Signature: 09/24/2017 11:59:31 AM Version By: Curtis Sites Previous Signature: 09/24/2017 10:22:38 AM Version By: Curtis Sites Previous Signature: 09/24/2017 10:50:58 AM Version By: [name] MD, FACS Entered By: [name] on 09/24/2017 12:02:00 Shari Turner ([id number]) -------------------------------------------------------------------------------- HBO Safety Checklist Details Patient Name: Shari Turner Date of Service: 09/24/2017 10:00 AM Medical Record Number: [id number] Patient Account Number: [id number] Date of Birth/Sex: [birth year] (76 y.o. Female) Treating RN: Curtis Sites Primary Care Tameika Heckmann: [name] Other Clinician: Referring Titianna Loomis: [name] Treating Jameyah Fennewald/Extender: Rudene Re in Treatment: 8 HBO Safety Checklist Items Safety Checklist Consent Form Signed Patient voided / foley secured and emptied When did you last eato 0830 today Last dose of injectable or oral agent 09/24/17 AM Ostomy pouch emptied and vented if applicable NA All implantable devices assessed, documented and approved NA Intravenous access site secured and place NA Valuables secured Linens and cotton and cotton/polyester blend (less than 51% polyester) Personal oil-based products / skin lotions / body lotions removed Wigs or hairpieces removed NA Smoking or tobacco materials removed NA Books / newspapers / magazines / loose paper removed Cologne, aftershave, perfume and deodorant removed Jewelry removed (may wrap wedding band) Make-up removed Hair care products removed Battery operated devices (external) removed Heating patches and chemical warmers removed Titanium eyewear removed NA Nail polish cured greater  than 10 hours NA Casting material cured greater than 10 hours NA Hearing aids removed NA Loose dentures or partials removed NA Prosthetics have been removed NA Patient demonstrates correct use of air break device (if applicable) Patient concerns have been addressed Patient grounding bracelet on and cord attached to chamber Specifics for Inpatients (complete in addition to above) Medication  sheet sent with patient Intravenous medications needed or due during therapy sent with patient Drainage tubes (e.g. nasogastric tube or chest tube secured and vented) Endotracheal or Tracheotomy tube secured Cuff deflated of air and inflated with saline Airway suctioned Electronic Signature(s) Signed: 09/24/2017 10:21:52 AM By: Bennett Scrape, [name] ([id number]) Entered By: Curtis Sites on 09/24/2017 10:21:51

## 2017-09-25 ENCOUNTER — Encounter: Payer: Medicare Other | Admitting: Internal Medicine

## 2017-09-25 DIAGNOSIS — E11622 Type 2 diabetes mellitus with other skin ulcer: Secondary | ICD-10-CM | POA: Diagnosis not present

## 2017-09-25 LAB — GLUCOSE, CAPILLARY
Glucose-Capillary: 195 mg/dL — ABNORMAL HIGH (ref 65–99)
Glucose-Capillary: 151 mg/dL — ABNORMAL HIGH (ref 65–99)

## 2017-09-25 NOTE — Progress Notes (Addendum)
TIANNA, BAUS ([id number]) Visit Report for 09/25/2017 Arrival Information Details Patient Name: Shari Turner, Shari Turner Date of Service: 09/25/2017 10:00 AM Medical Record Number: [id number] Patient Account Number: [id number] Date of Birth/Sex: Feb 02, 1941 (76 y.o. Female) Treating RN: Cornell Barman Primary Care Anina Schnake: Gershon Crane Other Clinician: Referring Shoshana Johal: Gershon Crane Treating Wilhelmina Hark/Extender: Tito Dine in Treatment: 8 Visit Information History Since Last Visit Added or deleted any medications: No Patient Arrived: Wheel Chair Any new allergies or adverse reactions: No Arrival Time: 09:10 Had a fall or experienced change in No Accompanied By: grandaughter activities of daily living that may affect Transfer Assistance: None risk of falls: Patient Identification Verified: Yes Signs or symptoms of abuse/neglect since last visito No Secondary Verification Process Yes Hospitalized since last visit: No Completed: Pain Present Now: No Patient Has Alerts: Yes Patient Alerts: Patient on Blood Thinner DMII Eliquis Electronic Signature(s) Signed: 09/25/2017 9:23:22 AM By: Gretta Cool, [name], RN, CWS, [name], [name] Entered By: Gretta Cool, [name], RN, CWS, Kim on 09/25/2017 09:23:22 Shari Turner ([id number]) -------------------------------------------------------------------------------- Encounter Discharge Information Details Patient Name: Shari Turner Date of Service: 09/25/2017 10:00 AM Medical Record Number: [id number] Patient Account Number: [id number] Date of Birth/Sex: 04/12/1941 (76 y.o. Female) Treating RN: Cornell Barman Primary Care Liora Myles: Gershon Crane Other Clinician: Referring Sherin Murdoch: Gershon Crane Treating Jil Penland/Extender: Tito Dine in Treatment: 8 Encounter Discharge Information Items Discharge Pain Level: 0 Discharge Condition: Stable Ambulatory Status: Wheelchair Discharge Destination: Home Transportation: Private  Auto Accompanied By: granddaughter Schedule Follow-up Appointment: Yes Medication Reconciliation completed and Yes provided to Patient/Care Jonathon Castelo: Clinical Summary of Care: Electronic Signature(s) Signed: 09/25/2017 11:26:04 AM By: Gretta Cool, [name], RN, CWS, [name], [name] Entered By: Gretta Cool, [name], RN, CWS, Kim on 09/25/2017 11:26:03 Shari Turner ([id number]) -------------------------------------------------------------------------------- Patient/Caregiver Education Details Patient Name: Shari Turner Date of Service: 09/25/2017 10:00 AM Medical Record Number: [id number] Patient Account Number: [id number] Date of Birth/Gender: [birth year] (76 y.o. Female) Treating RN: Cornell Barman Primary Care Physician: Gershon Crane Other Clinician: Referring Physician: Gershon Crane Treating Physician/Extender: Tito Dine in Treatment: 8 Education Assessment Education Provided To: Patient Education Topics Provided Elevated Blood Sugar/ Impact on Healing: Handouts: Elevated Blood Sugars: How Do They Affect Wound Healing Methods: Explain/Verbal Responses: State content correctly Electronic Signature(s) Signed: 09/25/2017 11:38:18 AM By: Gretta Cool, [name], RN, CWS, [name], [name] Entered By: Gretta Cool, [name], RN, CWS, Kim on 09/25/2017 11:25:47 Shari Turner ([id number]) -------------------------------------------------------------------------------- Vitals Details Patient Name: Shari Turner Date of Service: 09/25/2017 10:00 AM Medical Record Number: [id number] Patient Account Number: [id number] Date of Birth/Sex: 1941/03/03 (76 y.o. Female) Treating RN: Cornell Barman Primary Care Jeydan Barner: Gershon Crane Other Clinician: Referring Tuvia Woodrick: Gershon Crane Treating Desyre Calma/Extender: Tito Dine in Treatment: 8 Vital Signs Time Taken: 09:15 Temperature (F): 98.6 Height (in): 62 Pulse (bpm): 88 Weight (lbs): 148 Respiratory Rate (breaths/min): 16 Body Mass Index (BMI):  27.1 Blood Pressure (mmHg): 118/68 Capillary Blood Glucose (mg/dl): 195 Reference Range: 80 - 120 mg / dl Electronic Signature(s) Signed: 09/25/2017 9:23:50 AM By: Gretta Cool, [name], RN, CWS, [name], [name] Entered By: Gretta Cool, [name], RN, CWS, Kim on 09/25/2017 09:23:50

## 2017-09-25 NOTE — Progress Notes (Addendum)
SYMANTHA, Turner ([id number]) Visit Report for 09/25/2017 HBO Details Patient Name: Shari Turner, Shari Turner Date of Service: 09/25/2017 10:00 AM Medical Record Number: [id number] Patient Account Number: [id number] Date of Birth/Sex: Jan 07, 1941 (76 y.o. 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 HBO Treatment Details Treatment Number: 15 Patient Type: Outpatient Chamber Type: Monoplace Chamber Serial #: [id number] Treatment Protocol: 2.0 ATA with 90 minutes oxygen, and no air breaks Treatment Details Compression Rate Down: 1.5 psi / minute De-Compression Rate Up: 1.5 psi / minute Compress Tx Pressure Air breaks and breathing periods Decompress Decompress Begins Reached (leave unused spaces blank) Begins Ends Chamber Pressure (ATA) 1 2 - - - - - - 2 1 Clock Time (24 hr) 09:20 09:30 - - - - - - 11:01 11:11 Treatment Length: 111 (minutes) Treatment Segments: 4 Capillary Blood Glucose Pre Capillary Blood Glucose (mg/dl): Post Capillary Blood Glucose (mg/dl): Vital Signs Capillary Blood Glucose Reference Range: 80 - 120 mg / dl HBO Diabetic Blood Glucose Intervention Range: <131 mg/dl or >696 mg/dl Time Vitals Blood Respiratory Capillary Blood Glucose Pulse Action Type: Pulse: Temperature: Taken: Pressure: Rate: Glucose (mg/dl): Meter #: Oximetry (%) Taken: Pre 09:15 118/68 88 16 98.6 195 Post 11:15 120/68 88 16 98.1 151 Treatment Response Treatment Toleration: Well Treatment Completion Treatment Completed without Adverse Event Status: Esli Jernigan Notes no concerns with treatment given HBO Attestation I certify that I  supervised this HBO treatment in accordance with Medicare guidelines. A trained emergency response Yes team is readily available per hospital policies and procedures. GIULIA, BERMINGHAM ([id number]) Continue HBOT as ordered. Yes Electronic Signature(s) Signed: 09/26/2017 8:14:14 AM By: Baltazar Najjar MD Previous Signature: 09/25/2017 11:24:29 AM Version By: [name] [name], RN, CWS, [name], [name] Previous Signature: 09/25/2017 11:02:27 AM Version By: [name] [name], RN, CWS, [name], [name] Previous Signature: 09/25/2017 9:31:03 AM Version By: [name], [name], RN, CWS, [name], [name] Previous Signature: 09/25/2017 9:25:27 AM Version By: [name], [name], RN, CWS, [name], [name] Entered By: Baltazar Najjar on 09/25/2017 17:46:16 SHARNE, BALOUGH ([id number]) -------------------------------------------------------------------------------- HBO Safety Checklist Details Patient Name: Shari Turner Date of Service: 09/25/2017 10:00 AM Medical Record Number: [id number] Patient Account Number: [id number] Date of Birth/Sex: 04-18-1941 (76 y.o. Female) Treating RN: [name] Primary Care Jeret Goyer: [name] Other Clinician: Referring Aliese Brannum: [name] Treating Harlin Mazzoni/Extender: Altamese [place] in Treatment: 8 HBO Safety Checklist Items Safety Checklist Consent Form Signed Patient voided / foley secured and emptied When did you last eato 7am Last dose of injectable or oral agent 7am Ostomy pouch emptied and vented if applicable NA All implantable devices assessed, documented and approved NA Intravenous access site secured and place NA Valuables secured Linens and cotton and cotton/polyester blend (less than 51% polyester) Personal oil-based products / skin lotions / body lotions removed Wigs or hairpieces removed NA Smoking or tobacco materials removed NA Books / newspapers / magazines / loose paper removed Cologne, aftershave, perfume and deodorant removed Jewelry removed (may wrap wedding  band) NA Make-up removed Hair care products removed Battery operated devices (external) removed NA Heating patches and chemical warmers removed Titanium eyewear removed NA Nail polish cured greater than 10 hours Casting material cured greater than 10 hours NA Hearing aids removed NA Loose dentures or partials removed Prosthetics have  been removed NA Patient demonstrates correct use of air break device (if applicable) Patient concerns have been addressed Patient grounding bracelet on and cord attached to chamber Specifics for Inpatients (complete in addition to above) Medication sheet sent with patient Intravenous medications needed or due during therapy sent with patient Drainage tubes (e.g. nasogastric tube or chest tube secured and vented) Endotracheal or Tracheotomy tube secured Cuff deflated of air and inflated with saline Airway suctioned Electronic Signature(s) Signed: 09/25/2017 10:41:42 AM By: [name], [name], RN, CWS, [name], [name] Previous Signature: 09/25/2017 9:25:00 AM Version By: [name], [name], RN, CWS, [name], [name] [name], [name] ([id number]) Entered By: [name], [name], RN, CWS, Kim on 09/25/2017 10:41:41

## 2017-09-26 ENCOUNTER — Encounter: Payer: Medicare Other | Admitting: Internal Medicine

## 2017-09-26 DIAGNOSIS — E11622 Type 2 diabetes mellitus with other skin ulcer: Secondary | ICD-10-CM | POA: Diagnosis not present

## 2017-09-26 LAB — GLUCOSE, CAPILLARY
GLUCOSE-CAPILLARY: 210 mg/dL — AB (ref 65–99)
Glucose-Capillary: 205 mg/dL — ABNORMAL HIGH (ref 65–99)

## 2017-09-27 ENCOUNTER — Encounter: Payer: Medicare Other | Admitting: Nurse Practitioner

## 2017-09-27 DIAGNOSIS — E11622 Type 2 diabetes mellitus with other skin ulcer: Secondary | ICD-10-CM | POA: Diagnosis not present

## 2017-09-27 LAB — GLUCOSE, CAPILLARY
GLUCOSE-CAPILLARY: 164 mg/dL — AB (ref 65–99)
Glucose-Capillary: 228 mg/dL — ABNORMAL HIGH (ref 65–99)

## 2017-09-27 NOTE — Progress Notes (Signed)
Shari Turner, Shari Turner ([id number]) Visit Report for 09/25/2017 Physician Orders Details Patient Name: Shari Turner, Shari Turner Date of Service: 09/25/2017 10:00 AM Medical Record Number: [id number] Patient Account Number: [id number] Date of Birth/Sex: Sep 02, 1941 (76 y.o. Female) Treating RN: Cornell Barman Primary Care Provider: Gershon Crane Other Clinician: Referring Provider: Gershon Crane Treating Provider/Extender: Tito Dine in Treatment: 8 Verbal / Phone Orders: No Diagnosis Coding Hyperbaric Oxygen Therapy o Indication: - Chronic Refractory Osteomyelitis o If appropriate for treatment, begin HBOT per protocol: o 2.0 ATA for 90 Minutes without Air Breaks o One treatment per day (delivered Monday through Friday unless otherwise specified in Special Instructions below): o Total # of Treatments: - 40 o Finger stick Blood Glucose Pre- and Post- HBOT Treatment. o Follow Hyperbaric Oxygen Glycemia Protocol GLYCEMIA INTERVENTIONS PROTOCOL PRE-HBO GLYCEMIA INTERVENTIONS ACTION INTERVENTION Obtain pre-HBO capillary blood glucose 1 (ensure physician order is in chart). A. Notify HBO physician and await physician orders. 2 If result is 70 mg/dl or below: B. If the result meets the hospital definition of a critical result, follow hospital policy. A. Give patient an 8 ounce Glucerna Shake, an 8 ounce Ensure, or 8 ounces of a Glucerna/Ensure equivalent dietary supplement*. B. Wait 30 minutes. C. Retest patientos capillary blood If result is 71 mg/dl to 130 mg/dl: glucose (CBG). D. If result greater than or equal to 110 mg/dl, proceed with HBO. If result less than 110 mg/dl, notify HBO physician and consider holding HBO. If result is 131 mg/dl to 249 mg/dl: A. Proceed with HBO. A. Notify HBO physician and await physician orders. B. It is recommended to hold HBO and If result is 250 mg/dl or greater: do blood/urine ketone testing. C. If the result meets the  hospital definition of a critical result, follow hospital policy. POST-HBO GLYCEMIA INTERVENTIONS ACTION INTERVENTION Shari Turner, Shari Turner ([id number]) 1 Obtain post HBO capillary blood glucose (ensure physician order is in chart). A. Notify HBO physician and await physician orders. 2 If result is 70 mg/dl or below: B. If the result meets the hospital definition of a critical result, follow hospital policy. A. Give patient an 8 ounce Glucerna Shake, an 8 ounce Ensure, or 8 ounces of a Glucerna/Ensure equivalent dietary supplement*. B. Wait 15 minutes for symptoms of hypoglycemia (i.e. nervousness, anxiety, sweating, chills, If result is 71 mg/dl to 100 mg/dl: clamminess, irritability, confusion, tachycardia or dizziness). C. If patient asymptomatic, discharge patient. If patient symptomatic, repeat capillary blood glucose (CBG) and notify HBO physician. If result is 101 mg/dl to 249 mg/dl: A. Discharge patient. A. Notify HBO physician and await physician orders. B. It is recommended to do blood/urine If result is 250 mg/dl or greater: ketone testing. C. If the result meets the hospital definition of a critical result, follow hospital policy. *Juice or candies are NOT equivalent products. If patient refuses the Glucerna or Ensure, please consult the hospital dietitian for an appropriate substitute. Electronic Signature(s) Signed: 09/25/2017 11:25:28 AM By: Gretta Cool, [name], RN, CWS, [name], [name] Signed: 09/26/2017 8:14:14 AM By: Linton Ham MD Entered By: Gretta Cool, [name], RN, CWS, Kim on 09/25/2017 11:25:28 Shari Turner ([id number]) -------------------------------------------------------------------------------- [place] Details Patient Name: Shari Turner Date of Service: 09/25/2017 Medical Record Number: [id number] Patient Account Number: [id number] Date of Birth/Sex: 1941-02-15 (76 y.o. 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, unspecified Facility Procedures CPT4 Code: 32023343 Description: (Facility Use Only) HBOT, full body chamber, 25min Modifier: Quantity: 4 Physician Procedures CPT4 Code: 5686168 Description: [phone] - WC PHYS HYPERBARIC OXYGEN THERAPY ICD-10 Diagnosis Description 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

## 2017-09-28 ENCOUNTER — Encounter: Payer: Medicare Other | Admitting: Physician Assistant

## 2017-09-28 DIAGNOSIS — E11622 Type 2 diabetes mellitus with other skin ulcer: Secondary | ICD-10-CM | POA: Diagnosis not present

## 2017-09-28 LAB — GLUCOSE, CAPILLARY
GLUCOSE-CAPILLARY: 225 mg/dL — AB (ref 65–99)
GLUCOSE-CAPILLARY: 156 mg/dL — AB (ref 65–99)

## 2017-09-28 NOTE — Progress Notes (Signed)
MARIDEL, PIXLER ([id number]) Visit Report for 09/27/2017 Arrival Information Details Patient Name: Shari Turner, Shari Turner Date of Service: 09/27/2017 10:00 AM Medical Record Number: [id number] Patient Account Number: [id number] Date of Birth/Sex: [birth year] (76 y.o. Female) Treating RN: Primary Care Keiana Tavella: Gershon Crane Other Clinician: Jacqulyn Bath Referring Linzey Ramser: Gershon Crane Treating Jazir Newey/Extender: Cathie Olden in Treatment: 8 Visit Information History Since Last Visit Added or deleted any medications: No Patient Arrived: Wheel Chair Any new allergies or adverse reactions: No Arrival Time: 09:35 Had a fall or experienced change in No Accompanied By: granddaughter, Arby Barrette activities of daily living that may affect Transfer Assistance: Manual risk of falls: Patient Identification Verified: Yes Signs or symptoms of abuse/neglect since last visito No Secondary Verification Process Yes Hospitalized since last visit: No Completed: Pain Present Now: No Patient Has Alerts: Yes Patient Alerts: Patient on Blood Thinner DMII Eliquis Electronic Signature(s) Signed: 09/27/2017 12:59:07 PM By: Lorine Bears RCP, RRT, CHT Entered By: Lorine Bears on 09/27/2017 09:47:39 Shari Turner ([id number]) -------------------------------------------------------------------------------- Encounter Discharge Information Details Patient Name: Shari Turner Date of Service: 09/27/2017 10:00 AM Medical Record Number: [id number] Patient Account Number: [id number] Date of Birth/Sex: [birth year] (76 y.o. Female) Treating RN: Primary Care Aryel Edelen: Gershon Crane Other Clinician: Jacqulyn Bath Referring Debi Cousin: Gershon Crane Treating Brogen Duell/Extender: Cathie Olden in Treatment: 8 Encounter Discharge Information Items Discharge Pain Level: 0 Discharge Condition: Stable Ambulatory Status: Wheelchair Discharge Destination:  Home Transportation: Private Auto granddaughter, Accompanied By: Arby Barrette Schedule Follow-up Appointment: No Medication Reconciliation completed and No provided to Patient/Care Gicela Schwarting: Clinical Summary of Care: Notes Patient has an HBO treatment scheduled on 09/28/17 at 10:00 am. Electronic Signature(s) Signed: 09/27/2017 12:59:07 PM By: Lorine Bears RCP, RRT, CHT Entered By: Lorine Bears on 09/27/2017 12:58:45 Shari Turner ([id number]) -------------------------------------------------------------------------------- Vitals Details Patient Name: Shari Turner Date of Service: 09/27/2017 10:00 AM Medical Record Number: [id number] Patient Account Number: [id number] Date of Birth/Sex: 1940/11/22 (76 y.o. Female) Treating RN: Primary Care Naysha Sholl: Gershon Crane Other Clinician: Jacqulyn Bath Referring Hiilani Jetter: Gershon Crane Treating Mala Gibbard/Extender: Cathie Olden in Treatment: 8 Vital Signs Time Taken: 09:37 Temperature (F): 98.3 Height (in): 62 Pulse (bpm): 90 Weight (lbs): 148 Respiratory Rate (breaths/min): 16 Body Mass Index (BMI): 27.1 Blood Pressure (mmHg): 122/58 Capillary Blood Glucose (mg/dl): 228 Reference Range: 80 - 120 mg / dl Electronic Signature(s) Signed: 09/27/2017 12:59:07 PM By: Lorine Bears RCP, RRT, CHT Entered By: Lorine Bears on 09/27/2017 10:07:36

## 2017-09-28 NOTE — Progress Notes (Signed)
Shari, Turner ([id number]) Visit Report for 09/26/2017 Arrival Information Details Patient Name: Shari Turner, Shari Turner Date of Service: 09/26/2017 10:00 AM Medical Record Number: [id number] Patient Account Number: [id number] Date of Birth/Sex: 17-Apr-1941 (76 y.o. Female) Treating RN: Primary Care Parilee Hally: Gershon Crane Other Clinician: Jacqulyn Bath Referring Aldrin Engelhard: Gershon Crane Treating Afshin Chrystal/Extender: Tito Dine in Treatment: 8 Visit Information History Since Last Visit Added or deleted any medications: No Patient Arrived: Wheel Chair Any new allergies or adverse reactions: No Arrival Time: 09:05 Had a fall or experienced change in No Accompanied By: granddaughter, [name] activities of daily living that may affect Transfer Assistance: Manual risk of falls: Patient Identification Verified: Yes Signs or symptoms of abuse/neglect since last visito No Secondary Verification Process Yes Pain Present Now: No Completed: Patient Has Alerts: Yes Patient Alerts: Patient on Blood Thinner DMII Eliquis Electronic Signature(s) Signed: 09/27/2017 12:59:07 PM By: Lorine Bears RCP, RRT, CHT Entered By: Lorine Bears on 09/26/2017 10:58:22 Shari Turner ([id number]) -------------------------------------------------------------------------------- Encounter Discharge Information Details Patient Name: Shari Turner Date of Service: 09/26/2017 10:00 AM Medical Record Number: [id number] Patient Account Number: [id number] Date of Birth/Sex: 10/16/1940 (76 y.o. Female) Treating RN: Primary Care Marche Hottenstein: Gershon Crane Other Clinician: Jacqulyn Bath Referring Tacoya Altizer: Gershon Crane Treating Perez Dirico/Extender: Tito Dine in Treatment: 8 Encounter Discharge Information Items Discharge Pain Level: 0 Discharge Condition: Stable Ambulatory Status: Wheelchair Discharge Destination: Home Transportation: Private  Auto daughter, Accompanied By: Georgina Peer Schedule Follow-up Appointment: No Medication Reconciliation completed and No provided to Patient/Care Analissa Bayless: Clinical Summary of Care: Notes Patient has an HBO treatment scheduled on 09/27/17 at 10:00 am. Electronic Signature(s) Signed: 09/27/2017 12:59:07 PM By: Lorine Bears RCP, RRT, CHT Entered By: Lorine Bears on 09/26/2017 11:46:19 Shari Turner ([id number]) -------------------------------------------------------------------------------- Vitals Details Patient Name: Shari Turner Date of Service: 09/26/2017 10:00 AM Medical Record Number: [id number] Patient Account Number: [id number] Date of Birth/Sex: Nov 05, 1940 (76 y.o. Female) Treating RN: Primary Care Kyliyah Stirn: Gershon Crane Other Clinician: Jacqulyn Bath Referring Ronel Rodeheaver: Gershon Crane Treating Jamariyah Johannsen/Extender: Tito Dine in Treatment: 8 Vital Signs Time Taken: 09:06 Temperature (F): 98.4 Height (in): 62 Pulse (bpm): 84 Weight (lbs): 148 Respiratory Rate (breaths/min): 16 Body Mass Index (BMI): 27.1 Blood Pressure (mmHg): 122/60 Capillary Blood Glucose (mg/dl): 205 Reference Range: 80 - 120 mg / dl Electronic Signature(s) Signed: 09/27/2017 12:59:07 PM By: Lorine Bears RCP, RRT, CHT Entered By: Becky Sax, Amado Nash on 09/26/2017 10:58:57

## 2017-09-29 NOTE — Progress Notes (Signed)
Shari Turner ([id number]) Visit Report for 09/27/2017 HBO Details Patient Name: Shari Turner, Shari Turner Date of Service: 09/27/2017 10:00 AM Medical Record Number: [id number] Patient Account Number: [id number] Date of Birth/Sex: 05/15/1941 (76 y.o. 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 HBO Treatment Details Treatment Number: 17 Patient Type: Outpatient Chamber Type: Monoplace Chamber Serial #: [id number] Treatment Protocol: 2.0 ATA with 90 minutes oxygen, and no air breaks Treatment Details Compression Rate Down: 1.5 psi / minute De-Compression Rate Up: 1.5 psi / minute Compress Tx Pressure Air breaks and breathing periods Decompress Decompress Begins Reached (leave unused spaces blank) Begins Ends Chamber Pressure (ATA) 1 2 - - - - - - 2 1 Clock Time (24 hr) 10:00 10:10 - - - - - - 11:40 11:50 Treatment Length: 110 (minutes) Treatment Segments: 4 Capillary Blood Glucose Pre Capillary Blood Glucose (mg/dl): Post Capillary Blood Glucose (mg/dl): Vital Signs Capillary Blood Glucose Reference Range: 80 - 120 mg / dl HBO Diabetic Blood Glucose Intervention Range: <131 mg/dl or >542 mg/dl Time Vitals Blood Respiratory Capillary Blood Glucose Pulse Action Type: Pulse: Temperature: Taken: Pressure: Rate: Glucose (mg/dl): Meter #: Oximetry (%) Taken: Pre 09:37 122/58 90 16 98.3 228 1 none per protocol Post 11:54 112/70 72 16 98.1 164 1 none per protocol Treatment Response Treatment Completion Status: Treatment Completed without Adverse Event HBO Attestation I certify that I supervised this HBO treatment in  accordance with Medicare guidelines. A trained emergency response Yes team is readily available per hospital policies and procedures. Continue HBOT as ordered. Yes Electronic Signature(s) ELIM, HASBROOK ([id number]) Signed: 09/27/2017 4:55:16 PM By: Bonnell Public Entered By: Bonnell Public on 09/27/2017 12:11:29 Shari Turner ([id number]) -------------------------------------------------------------------------------- HBO Safety Checklist Details Patient Name: Shari Turner Date of Service: 09/27/2017 10:00 AM Medical Record Number: [id number] Patient Account Number: [id number] Date of Birth/Sex: Mar 03, 1941 (76 y.o. Female) Treating RN: Primary Care Ngina Royer: [name] Other Clinician: [name] Referring Cranford Blessinger: [name] Treating Marvina Danner/Extender: Kathreen Cosier in Treatment: 8 HBO Safety Checklist Items Safety Checklist Consent Form Signed Patient voided / foley secured and emptied When did you last eato 09/27/17 am Last dose of injectable or oral agent 09/27/17 am Ostomy pouch emptied and vented if applicable NA All implantable devices assessed, documented and approved NA Intravenous access site secured and place NA Valuables secured Linens and cotton and cotton/polyester blend (less than 51% polyester) Personal oil-based products / skin lotions / body lotions removed Wigs or hairpieces removed NA Smoking or tobacco materials removed NA Books / newspapers / magazines / loose paper removed Cologne, aftershave, perfume and deodorant removed Jewelry removed (may wrap wedding band) Make-up removed Hair care products removed Battery operated devices (external) removed Heating patches and chemical warmers removed Titanium eyewear removed NA Nail polish cured greater than 10 hours Casting material cured greater than 10 hours NA Hearing aids removed NA Loose dentures or partials removed NA Prosthetics have been removed NA Patient  demonstrates correct use of air break device (if applicable) Patient concerns have been addressed Patient grounding bracelet on and cord attached to chamber Specifics for Inpatients (complete in addition to above) Medication sheet sent with patient Intravenous medications needed or due during therapy sent with patient Drainage tubes (e.g. nasogastric tube or chest tube secured and  vented) Endotracheal or Tracheotomy tube secured Cuff deflated of air and inflated with saline Airway suctioned Electronic Signature(s) Signed: 09/27/2017 12:59:07 PM By: Dayton Martes RCP, RRT, CHT [place], [name] ([id number]) Entered By: Dayton Martes on 09/27/2017 10:08:57

## 2017-09-29 NOTE — Progress Notes (Addendum)
LAURELAI, LEPP ([id number]) Visit Report for 09/28/2017 Arrival Information Details Patient Name: Shari Turner, Shari Turner Date of Service: 09/28/2017 10:00 AM Medical Record Number: [id number] Patient Account Number: [id number] Date of Birth/Sex: 1941-03-06 (76 y.o. Female) Treating RN: Cornell Barman Primary Care Jaelene Garciagarcia: Gershon Crane Other Clinician: Jacqulyn Bath Referring Rayshon Albaugh: Gershon Crane Treating Shavone Nevers/Extender: Melburn Hake, [name] Weeks in Treatment: 9 Visit Information History Since Last Visit Added or deleted any medications: No Patient Arrived: Wheel Chair Any new allergies or adverse reactions: No Arrival Time: 10:00 Had a fall or experienced change in No Accompanied By: grandaughter activities of daily living that may affect Transfer Assistance: None risk of falls: Patient Identification Verified: Yes Signs or symptoms of abuse/neglect since last visito No Secondary Verification Process Yes Hospitalized since last visit: No Completed: Pain Present Now: No Patient Has Alerts: Yes Patient Alerts: Patient on Blood Thinner DMII Eliquis Electronic Signature(s) Signed: 09/28/2017 10:00:48 AM By: Gretta Cool, [name], RN, CWS, [name], [name] Entered By: Gretta Cool, [name], RN, CWS, Kim on 09/28/2017 10:00:48 Shari Turner ([id number]) -------------------------------------------------------------------------------- Encounter Discharge Information Details Patient Name: Shari Turner Date of Service: 09/28/2017 10:00 AM Medical Record Number: [id number] Patient Account Number: [id number] Date of Birth/Sex: [birth year] (76 y.o. Female) Treating RN: Cornell Barman Primary Care Kamaron Deskins: Gershon Crane Other Clinician: Cornell Barman Referring Pam Vanalstine: Gershon Crane Treating Jasani Dolney/Extender: Melburn Hake, [name] Weeks in Treatment: 9 Encounter Discharge Information Items Discharge Pain Level: 0 Discharge Condition: Stable Ambulatory Status: Wheelchair Discharge Destination:  Home Transportation: Private Auto Accompanied By: granddaughter Schedule Follow-up Appointment: Yes Medication Reconciliation completed and Yes provided to Patient/Care Chablis Losh: Clinical Summary of Care: Electronic Signature(s) Signed: 09/28/2017 12:13:27 PM By: Gretta Cool, [name], RN, CWS, [name], [name] Entered By: Gretta Cool, [name], RN, CWS, Kim on 09/28/2017 12:13:26 Shari Turner ([id number]) -------------------------------------------------------------------------------- Patient/Caregiver Education Details Patient Name: Shari Turner Date of Service: 09/28/2017 10:00 AM Medical Record Number: [id number] Patient Account Number: [id number] Date of Birth/Gender: May 21, 1941 (76 y.o. Female) Treating RN: Cornell Barman Primary Care Physician: Gershon Crane Other Clinician: Cornell Barman Referring Physician: Gershon Crane Treating Physician/Extender: Sharalyn Ink in Treatment: 9 Education Assessment Education Provided To: Patient Education Topics Provided Elevated Blood Sugar/ Impact on Healing: Handouts: Elevated Blood Sugars: How Do They Affect Wound Healing Methods: Demonstration, Explain/Verbal Responses: State content correctly Hyperbaric Oxygenation: Handouts: Hyperbaric Oxygen Methods: Demonstration Responses: State content correctly Electronic Signature(s) Signed: 09/28/2017 4:58:31 PM By: Gretta Cool, [name], RN, CWS, [name], [name] Entered By: Gretta Cool, [name], RN, CWS, Kim on 09/28/2017 12:13:09 Shari Turner ([id number]) -------------------------------------------------------------------------------- Vitals Details Patient Name: Shari Turner Date of Service: 09/28/2017 10:00 AM Medical Record Number: [id number] Patient Account Number: [id number] Date of Birth/Sex: 01/21/1941 (76 y.o. Female) Treating RN: Cornell Barman Primary Care Vanna Sailer: Gershon Crane Other Clinician: Jacqulyn Bath Referring Kseniya Grunden: Gershon Crane Treating Abbygale Lapid/Extender: Melburn Hake, [name] Weeks in  Treatment: 9 Vital Signs Time Taken: 09:00 Temperature (F): 97.9 Height (in): 62 Pulse (bpm): 88 Weight (lbs): 148 Respiratory Rate (breaths/min): 16 Body Mass Index (BMI): 27.1 Blood Pressure (mmHg): 118/60 Capillary Blood Glucose (mg/dl): 225 Reference Range: 80 - 120 mg / dl Electronic Signature(s) Signed: 09/28/2017 10:01:17 AM By: Gretta Cool, [name], RN, CWS, [name], [name] Entered By: Gretta Cool, [name], RN, CWS, Kim on 09/28/2017 10:01:16

## 2017-09-29 NOTE — Progress Notes (Addendum)
Shari Turner, Shari Turner ([id number]) Visit Report for 09/28/2017 HBO Details Patient Name: Shari Turner, Shari Turner Date of Service: 09/28/2017 10:00 AM Medical Record Number: [id number] Patient Account Number: [id number] Date of Birth/Sex: 09-10-1941 (76 y.o. 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 HBO Treatment Details Treatment Number: 18 Patient Type: Outpatient Chamber Type: Monoplace Chamber Serial #: [id number] Treatment Protocol: 2.0 ATA with 90 minutes oxygen, and no air breaks Treatment Details Compression Rate Down: 1.5 psi / minute De-Compression Rate Up: 1.5 psi / minute Compress Tx Pressure Air breaks and breathing periods Decompress Decompress Begins Reached (leave unused spaces blank) Begins Ends Chamber Pressure (ATA) 1 2 - - - - - - 2 1 Clock Time (24 hr) 09:51 10:01 - - - - - - 11:31 11:43 Treatment Length: 112 (minutes) Treatment Segments: 4 Capillary Blood Glucose Pre Capillary Blood Glucose (mg/dl): Post Capillary Blood Glucose (mg/dl): Vital Signs Capillary Blood Glucose Reference Range: 80 - 120 mg / dl HBO Diabetic Blood Glucose Intervention Range: <131 mg/dl or >846 mg/dl Time Vitals Blood Respiratory Capillary Blood Glucose Pulse Action Type: Pulse: Temperature: Taken: Pressure: Rate: Glucose (mg/dl): Meter #: Oximetry (%) Taken: Pre 09:00 118/60 88 16 97.9 225 Post 11:45 112/78 72 16 97.9 156 Treatment Response Treatment Toleration: Well Treatment Completion Treatment Completed without Adverse Event Status: Shari Turner Notes Patient is tolerating HBO therapy well at this time. We will  continue with the current treatment plan as previously ordered. HBO Attestation Continue HBOT as ordered. Shari Turner, Shari Turner ([id number]) Electronic Signature(s) Signed: 09/30/2017 1:59:17 AM By: [name] PA-C Previous Signature: 09/28/2017 12:12:14 PM Version By: [name] [name], RN, CWS, [name], [name] Previous Signature: 09/28/2017 12:12:01 PM Version By: [name] [name], RN, CWS, [name], [name] Previous Signature: 09/28/2017 11:33:01 AM Version By: [name], [name], RN, CWS, [name], [name] Previous Signature: 09/28/2017 10:22:42 AM Version By: [name], [name], RN, CWS, [name], [name] Entered By: [name] on 09/30/2017 01:58:43 Shari Turner, Shari Turner ([id number]) -------------------------------------------------------------------------------- HBO Safety Checklist Details Patient Name: Shari Turner Date of Service: 09/28/2017 10:00 AM Medical Record Number: [id number] Patient Account Number: [id number] Date of Birth/Sex: 1940-11-05 (76 y.o. Female) Treating RN: [name] Primary Care Shari Turner: [name] Other Clinician: [name] Referring Shari Turner: [name] Treating [name]: [name], [name] Weeks in Treatment: 9 HBO Safety Checklist Items Safety Checklist Consent Form Signed Patient voided / foley secured and emptied When did you last eato 7am Last dose of injectable or oral agent 7am Ostomy pouch emptied and vented if applicable NA All implantable devices assessed, documented and approved NA Intravenous access site secured and place NA Valuables secured Linens and cotton and cotton/polyester blend (less than 51% polyester) Personal oil-based products / skin lotions / body lotions removed Wigs or hairpieces removed NA Smoking or tobacco materials removed Books / newspapers / magazines / loose paper removed Cologne, aftershave, perfume and deodorant removed Jewelry removed (may wrap wedding band) Make-up removed Hair care products removed Battery operated devices  (external) removed NA Heating patches and chemical warmers removed NA Titanium eyewear removed NA Nail polish cured greater than 10 hours Casting material cured greater than 10 hours NA Hearing aids removed NA Loose dentures or partials removed Prosthetics have been removed NA Patient demonstrates correct use  of air break device (if applicable) Patient concerns have been addressed Patient grounding bracelet on and cord attached to chamber Specifics for Inpatients (complete in addition to above) Medication sheet sent with patient Intravenous medications needed or due during therapy sent with patient Drainage tubes (e.g. nasogastric tube or chest tube secured and vented) Endotracheal or Tracheotomy tube secured Cuff deflated of air and inflated with saline Airway suctioned Electronic Signature(s) Signed: 09/28/2017 10:18:47 AM By: [name], [name], RN, CWS, [name], [name] [place], [name] ([id number]) Entered By: [name], [name], RN, CWS, Kim on 09/28/2017 10:18:46

## 2017-09-30 NOTE — Progress Notes (Signed)
Shari Turner, Shari Turner ([id number]) Visit Report for 09/26/2017 HBO Details Patient Name: Shari Turner, Shari Turner Date of Service: 09/26/2017 10:00 AM Medical Record Number: [id number] Patient Account Number: [id number] Date of Birth/Sex: August 18, 1941 (76 y.o. 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 HBO Treatment Details Treatment Number: 16 Patient Type: Outpatient Chamber Type: Monoplace Chamber Serial #: [id number] Treatment Protocol: 2.0 ATA with 90 minutes oxygen, and no air breaks Treatment Details Compression Rate Down: 1.5 psi / minute De-Compression Rate Up: 1.5 psi / minute Compress Tx Pressure Air breaks and breathing periods Decompress Decompress Begins Reached (leave unused spaces blank) Begins Ends Chamber Pressure (ATA) 1 2 - - - - - - 2 1 Clock Time (24 hr) 09:28 09:39 - - - - - - 11:09 11:19 Treatment Length: 111 (minutes) Treatment Segments: 4 Capillary Blood Glucose Pre Capillary Blood Glucose (mg/dl): Post Capillary Blood Glucose (mg/dl): Vital Signs Capillary Blood Glucose Reference Range: 80 - 120 mg / dl HBO Diabetic Blood Glucose Intervention Range: <131 mg/dl or >644 mg/dl Time Vitals Blood Respiratory Capillary Blood Glucose Pulse Action Type: Pulse: Temperature: Taken: Pressure: Rate: Glucose (mg/dl): Meter #: Oximetry (%) Taken: Pre 09:06 122/60 84 16 98.4 205 1 none Post 11:25 118/62 72 16 98.5 210 1 none per protocol Treatment Response Treatment Completion Status: Treatment Completed without Adverse Event Shari Turner Notes No concerns with treatment given HBO Attestation I certify that I  supervised this HBO treatment in accordance with Medicare guidelines. A trained emergency response Yes team is readily available per hospital policies and procedures. Continue HBOT as ordered. Shari Turner, Shari Turner ([id number]) Electronic Signature(s) Signed: 09/30/2017 7:28:22 AM By: Baltazar Najjar MD Entered By: Baltazar Najjar on 09/26/2017 15:28:27 Shari Turner ([id number]) -------------------------------------------------------------------------------- HBO Safety Checklist Details Patient Name: Shari Turner Date of Service: 09/26/2017 10:00 AM Medical Record Number: [id number] Patient Account Number: [id number] Date of Birth/Sex: 1940/12/18 (76 y.o. Female) Treating RN: Primary Care Mia Winthrop: [name] Other Clinician: [name] Referring Lauralynn Loeb: [name] Treating Aaiden Depoy/Extender: Altamese [place] in Treatment: 8 HBO Safety Checklist Items Safety Checklist Consent Form Signed Patient voided / foley secured and emptied When did you last eato 09/26/17 am Last dose of injectable or oral agent 09/26/17 am Ostomy pouch emptied and vented if applicable NA All implantable devices assessed, documented and approved NA Intravenous access site secured and place NA Valuables secured Linens and cotton and cotton/polyester blend (less than 51% polyester) Personal oil-based products / skin lotions / body lotions removed Wigs or hairpieces removed NA Smoking or tobacco materials removed NA Books / newspapers / magazines / loose paper removed Cologne, aftershave, perfume and deodorant removed Jewelry removed (may wrap wedding band) Make-up removed Hair care products removed Battery operated devices (external) removed Heating patches and chemical warmers removed Titanium eyewear removed NA Nail polish cured greater than 10 hours Casting material cured greater than 10 hours NA Hearing aids removed NA Loose dentures or partials  removed NA Prosthetics have been removed NA Patient demonstrates correct use of air break device (if applicable) Patient concerns have been addressed Patient grounding bracelet on and cord attached to chamber Specifics for Inpatients (complete in addition to above) Medication sheet sent with patient Intravenous medications needed or due during therapy sent with patient Drainage tubes (  e.g. nasogastric tube or chest tube secured and vented) Endotracheal or Tracheotomy tube secured Cuff deflated of air and inflated with saline Airway suctioned Electronic Signature(s) Signed: 09/27/2017 12:59:07 PM By: Dayton Martes RCP, RRT, CHT [place], [name] ([id number]) Entered By: Dayton Martes on 09/26/2017 11:00:00

## 2017-09-30 NOTE — Progress Notes (Signed)
LATOIA, EYSTER ([id number]) Visit Report for 09/28/2017 Problem List Details Patient Name: [name], [name] Date of Service: 09/28/2017 10:00 AM Medical Record Number: [id number] Patient Account Number: [id number] Date of Birth/Sex: [birth year] (76 y.o. 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, unspecified Facility  Procedures CPT4 Code: 02774128 Description: (Facility Use Only) HBOT, full body chamber, 81min Modifier: Quantity: 4 Physician Procedures CPT4 Code Description: [id number] [id number] - WC PHYS HYPERBARIC OXYGEN THERAPY ICD-10 Diagnosis Description 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

## 2017-09-30 NOTE — Progress Notes (Signed)
Shari, Turner ([id number]) Visit Report for 09/11/2017 HBO Details Patient Name: Shari Turner, Shari Turner Date of Service: 09/11/2017 10:00 AM Medical Record Number: [id number] Patient Account Number: [id number] Date of Birth/Sex: 1940-11-10 (76 y.o. 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 HBO Treatment Details Treatment Number: 11 Patient Type: Outpatient Chamber Type: Monoplace Chamber Serial #: [id number] Treatment Protocol: 2.0 ATA with 90 minutes oxygen, and no air breaks Treatment Details Compression Rate Down: 1.5 psi / minute De-Compression Rate Up: 1.5 psi / minute Compress Tx Pressure Air breaks and breathing periods Decompress Decompress Begins Reached (leave unused spaces blank) Begins Ends Chamber Pressure (ATA) 1 2 - - - - - - 2 1 Clock Time (24 hr) 09:34 09:44 - - - - - - 11:14 11:24 Treatment Length: 110 (minutes) Treatment Segments: 4 Capillary Blood Glucose Pre Capillary Blood Glucose (mg/dl): Post Capillary Blood Glucose (mg/dl): Vital Signs Capillary Blood Glucose Reference Range: 80 - 120 mg / dl HBO Diabetic Blood Glucose Intervention Range: <131 mg/dl or >086 mg/dl Time Vitals Blood Respiratory Capillary Blood Glucose Pulse Action Type: Pulse: Temperature: Taken: Pressure: Rate: Glucose (mg/dl): Meter #: Oximetry (%) Taken: Pre 09:13 126/60 66 16 98.8 211 1 none per protocol Post 11:31 112/60 72 16 98.2 292 1 none per protocol Treatment Response Treatment Completion Status: Treatment Completed without Adverse Event Electronic Signature(s) Signed: 09/20/2017 9:51:18 AM By: Dayton Martes RCP, RRT, CHT Signed: 09/30/2017 1:59:56 AM By: [name] PA-C Previous Signature: 09/11/2017 4:35:41 PM Version By: Dayton Martes RCP, RRT, CHT Previous Signature: 09/11/2017 5:06:08 PM Version By: [name] PA-C Previous Signature: 09/11/2017 3:27:53 PM Version By: Dayton Martes RCP, RRT, CHT Entered By: Dayton Martes on 09/20/2017 09:50:21 Shari Turner ([id number]) Shari Turner, Shari Turner ([id number]) -------------------------------------------------------------------------------- HBO Safety Checklist Details Patient Name: Shari Turner Date of Service: 09/11/2017 10:00 AM Medical Record Number: [id number] Patient Account Number: [id number] Date of Birth/Sex: 21-Feb-1941 (76 y.o. Female) Treating RN: Primary Care Joanette Silveria: [name] Other Clinician: [name] Referring Owen Pratte: [name] Treating Shakeel Disney/Extender: [name], [name] Weeks in Treatment: 6 HBO Safety Checklist Items Safety Checklist Consent Form Signed Patient voided / foley secured and emptied When did you last eato 09/11/17 am Last dose of injectable or oral agent 09/11/17 am Ostomy pouch emptied and vented if applicable NA All implantable devices assessed, documented and approved NA Intravenous access site secured and place NA Valuables secured Linens and cotton and cotton/polyester blend (less than 51% polyester) Personal oil-based products / skin lotions / body lotions removed Wigs or hairpieces removed NA Smoking or tobacco materials removed NA Books / newspapers / magazines / loose paper removed Cologne, aftershave, perfume and deodorant removed Jewelry removed (may wrap wedding band) Make-up removed Hair care products removed Battery operated devices (external) removed Heating patches and chemical warmers removed Titanium eyewear removed NA Nail polish cured greater than 10 hours Casting material cured greater  than 10 hours NA Hearing aids removed NA Loose dentures or partials removed NA Prosthetics have been removed NA Patient demonstrates correct use of air break device (if applicable) Patient concerns have been addressed Patient grounding bracelet on and cord attached to chamber Specifics for Inpatients (complete in addition to above) Medication sheet sent with patient Intravenous  medications needed or due during therapy sent with patient Drainage tubes (e.g. nasogastric tube or chest tube secured and vented) Endotracheal or Tracheotomy tube secured Cuff deflated of air and inflated with saline Airway suctioned Electronic Signature(s) Signed: 09/11/2017 3:27:53 PM By: Dayton Martes RCP, RRT, CHT Shari Turner, Shari Turner ([id number]) Entered By: Dayton Martes on 09/11/2017 09:37:10

## 2017-10-03 ENCOUNTER — Encounter: Payer: Medicare Other | Admitting: Internal Medicine

## 2017-10-03 DIAGNOSIS — E11622 Type 2 diabetes mellitus with other skin ulcer: Secondary | ICD-10-CM | POA: Diagnosis not present

## 2017-10-03 LAB — GLUCOSE, CAPILLARY
GLUCOSE-CAPILLARY: 168 mg/dL — AB (ref 65–99)
GLUCOSE-CAPILLARY: 172 mg/dL — AB (ref 65–99)

## 2017-10-04 ENCOUNTER — Encounter: Payer: Medicare Other | Admitting: Surgery

## 2017-10-04 DIAGNOSIS — E11622 Type 2 diabetes mellitus with other skin ulcer: Secondary | ICD-10-CM | POA: Diagnosis not present

## 2017-10-04 LAB — GLUCOSE, CAPILLARY
GLUCOSE-CAPILLARY: 199 mg/dL — AB (ref 65–99)
Glucose-Capillary: 164 mg/dL — ABNORMAL HIGH (ref 65–99)

## 2017-10-04 NOTE — Progress Notes (Signed)
Shari Turner, Shari Turner ([id number]) Visit Report for 10/03/2017 HBO Details Patient Name: Shari Turner, Shari Turner Date of Service: 10/03/2017 10:00 AM Medical Record Number: [id number] Patient Account Number: [id number] Date of Birth/Sex: February 01, 1941 (76 y.o. 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 HBO Treatment Details Treatment Number: 19 Patient Type: Outpatient Chamber Type: Monoplace Chamber Serial #: [id number] Treatment Protocol: 2.0 ATA with 90 minutes oxygen, and no air breaks Treatment Details Compression Rate Down: 1.5 psi / minute De-Compression Rate Up: 1.5 psi / minute Compress Tx Pressure Air breaks and breathing periods Decompress Decompress Begins Reached (leave unused spaces blank) Begins Ends Chamber Pressure (ATA) 1 2 - - - - - - 2 1 Clock Time (24 hr) 09:31 09:41 - - - - - - 11:11 11:21 Treatment Length: 110 (minutes) Treatment Segments: 4 Capillary Blood Glucose Pre Capillary Blood Glucose (mg/dl): Post Capillary Blood Glucose (mg/dl): Vital Signs Capillary Blood Glucose Reference Range: 80 - 120 mg / dl HBO Diabetic Blood Glucose Intervention Range: <131 mg/dl or >638 mg/dl Time Vitals Blood Respiratory Capillary Blood Glucose Pulse Action Type: Pulse: Temperature: Taken: Pressure: Rate: Glucose (mg/dl): Meter #: Oximetry (%) Taken: Pre 09:07 122/58 72 16 98.7 168 none per protocol Post 11:29 118/60 72 16 98.5 172 1 none per protocol Treatment Response Treatment Completion Status: Treatment Completed without Adverse Event Charnell Peplinski Notes no concerns with treatment given HBO  Attestation I certify that I supervised this HBO treatment in accordance with Medicare guidelines. A trained emergency response Yes team is readily available per hospital policies and procedures. Continue HBOT as ordered. Shari Turner, Shari Turner ([id number]) Electronic Signature(s) Signed: 10/03/2017 4:35:05 PM By: Baltazar Najjar MD Previous Signature: 10/03/2017 12:00:26 PM Version By: Curtis Sites Entered By: Baltazar Najjar on 10/03/2017 16:19:45 Shari Turner ([id number]) -------------------------------------------------------------------------------- HBO Safety Checklist Details Patient Name: Shari Turner Date of Service: 10/03/2017 10:00 AM Medical Record Number: [id number] Patient Account Number: [id number] Date of Birth/Sex: March 17, 1941 (76 y.o. Female) Treating RN: Curtis Sites Primary Care Zikeria Keough: [name] Other Clinician: [name] Referring Jaelee Laughter: [name] Treating Louay Myrie/Extender: Altamese [place] in Treatment: 9 HBO Safety Checklist Items Safety Checklist Consent Form Signed Patient voided / foley secured and emptied When did you last eato 10/03/2017 AM Last dose of injectable or oral agent 10/03/2017 AM Ostomy pouch emptied and vented if applicable NA All implantable devices assessed, documented and approved NA Intravenous access site secured and place NA Valuables secured Linens and cotton and cotton/polyester blend (less than 51% polyester) Personal oil-based products / skin lotions / body lotions removed Wigs or hairpieces removed NA Smoking or tobacco materials removed NA Books / newspapers / magazines / loose paper removed Cologne, aftershave, perfume and deodorant removed Jewelry removed (may wrap wedding band) Make-up removed Hair care products removed Battery operated devices (external) removed Heating patches and chemical warmers removed NA Titanium eyewear removed NA Nail polish cured greater than 10  hours NA Casting material cured greater than 10 hours NA Hearing aids removed NA Loose dentures or partials removed NA Prosthetics have been removed NA Patient demonstrates correct use of air break device (if applicable) Patient concerns have been addressed Patient grounding bracelet on and cord attached to chamber Specifics for Inpatients (complete in addition to above) Medication  sheet sent with patient Intravenous medications needed or due during therapy sent with patient Drainage tubes (e.g. nasogastric tube or chest tube secured and vented) Endotracheal or Tracheotomy tube secured Cuff deflated of air and inflated with saline Airway suctioned Electronic Signature(s) Signed: 10/03/2017 4:52:08 PM By: Bennett Scrape, [name] ([id number]) Entered By: Curtis Sites on 10/03/2017 09:40:04

## 2017-10-04 NOTE — Progress Notes (Signed)
CLOVIS, WARWICK ([id number]) Visit Report for 10/03/2017 Arrival Information Details Patient Name: Shari Turner Date of Service: 10/03/2017 10:00 AM Medical Record Number: [id number] Patient Account Number: [id number] Date of Birth/Sex: 1940/11/13 (76 y.o. Female) Treating RN: Montey Hora Primary Care Elick Aguilera: Gershon Crane Other Clinician: Jacqulyn Bath Referring Skylin Kennerson: Gershon Crane Treating Deane Melick/Extender: Tito Dine in Treatment: 9 Visit Information History Since Last Visit Added or deleted any medications: No Patient Arrived: Wheel Chair Any new allergies or adverse reactions: No Arrival Time: 09:05 Had a fall or experienced change in No Accompanied By: dtr activities of daily living that may affect Transfer Assistance: Manual risk of falls: Patient Identification Verified: Yes Signs or symptoms of abuse/neglect since last visito No Secondary Verification Process Yes Hospitalized since last visit: No Completed: Pain Present Now: No Patient Has Alerts: Yes Patient Alerts: Patient on Blood Thinner DMII Eliquis Electronic Signature(s) Signed: 10/03/2017 4:52:08 PM By: Montey Hora Entered By: Montey Hora on 10/03/2017 09:37:38 Shari Turner ([id number]) -------------------------------------------------------------------------------- Encounter Discharge Information Details Patient Name: Shari Turner Date of Service: 10/03/2017 10:00 AM Medical Record Number: [id number] Patient Account Number: [id number] Date of Birth/Sex: [birth year] (76 y.o. Female) Treating RN: Montey Hora Primary Care Grayland Daisey: Gershon Crane Other Clinician: Jacqulyn Bath Referring Cherrish Vitali: Gershon Crane Treating Korynn Kenedy/Extender: Tito Dine in Treatment: 9 Encounter Discharge Information Items Discharge Pain Level: 0 Discharge Condition: Stable Ambulatory Status: Wheelchair Discharge Destination:  Home Private Transportation: Auto Accompanied By: dtr Schedule Follow-up Appointment: Yes Medication Reconciliation completed and No provided to Patient/Care Sultan Pargas: Clinical Summary of Care: Notes Patient has HBOT scheduled for 10/04/2017 Electronic Signature(s) Signed: 10/03/2017 4:52:08 PM By: Montey Hora Entered By: Montey Hora on 10/03/2017 11:25:57 Shari Turner ([id number]) -------------------------------------------------------------------------------- Vitals Details Patient Name: Shari Turner Date of Service: 10/03/2017 10:00 AM Medical Record Number: [id number] Patient Account Number: [id number] Date of Birth/Sex: [birth year] (76 y.o. Female) Treating RN: Montey Hora Primary Care Marquese Burkland: Gershon Crane Other Clinician: Jacqulyn Bath Referring Shanetra Blumenstock: Gershon Crane Treating Zae Kirtz/Extender: Tito Dine in Treatment: 9 Vital Signs Time Taken: 09:07 Temperature (F): 98.7 Height (in): 62 Pulse (bpm): 72 Weight (lbs): 148 Respiratory Rate (breaths/min): 16 Body Mass Index (BMI): 27.1 Blood Pressure (mmHg): 122/58 Capillary Blood Glucose (mg/dl): 168 Reference Range: 80 - 120 mg / dl Electronic Signature(s) Signed: 10/03/2017 4:52:08 PM By: Montey Hora Entered By: Montey Hora on 10/03/2017 09:38:13

## 2017-10-05 ENCOUNTER — Encounter: Payer: Medicare Other | Admitting: Surgery

## 2017-10-05 DIAGNOSIS — E11622 Type 2 diabetes mellitus with other skin ulcer: Secondary | ICD-10-CM | POA: Diagnosis not present

## 2017-10-05 LAB — GLUCOSE, CAPILLARY
GLUCOSE-CAPILLARY: 210 mg/dL — AB (ref 65–99)
GLUCOSE-CAPILLARY: 175 mg/dL — AB (ref 65–99)

## 2017-10-05 NOTE — Progress Notes (Signed)
Shari Turner ([id number]) Visit Report for 10/04/2017 Arrival Information Details Patient Name: Shari Turner Date of Service: 10/04/2017 10:00 AM Medical Record Number: [id number] Patient Account Number: [id number] Date of Birth/Sex: Sep 09, 1941 (76 y.o. Female) Treating RN: Montey Hora Primary Care Sarie Stall: Gershon Crane Other Clinician: Jacqulyn Bath Referring Ramia Sidney: Gershon Crane Treating Sami Froh/Extender: Frann Rider in Treatment: 9 Visit Information History Since Last Visit Added or deleted any medications: No Patient Arrived: Wheel Chair Any new allergies or adverse reactions: No Arrival Time: 09:11 Had a fall or experienced change in No Accompanied By: dtr activities of daily living that may affect Transfer Assistance: Manual risk of falls: Patient Identification Verified: Yes Signs or symptoms of abuse/neglect since last visito No Secondary Verification Process Yes Hospitalized since last visit: No Completed: Pain Present Now: No Patient Has Alerts: Yes Patient Alerts: Patient on Blood Thinner DMII Eliquis Electronic Signature(s) Signed: 10/04/2017 4:29:35 PM By: Montey Hora Entered By: Montey Hora on 10/04/2017 10:33:22 Shari Turner ([id number]) -------------------------------------------------------------------------------- Encounter Discharge Information Details Patient Name: Shari Turner Date of Service: 10/04/2017 10:00 AM Medical Record Number: [id number] Patient Account Number: [id number] Date of Birth/Sex: 1941-04-08 (76 y.o. Female) Treating RN: Montey Hora Primary Care Payne Garske: Gershon Crane Other Clinician: Jacqulyn Bath Referring Ronaldo Crilly: Gershon Crane Treating Linsie Lupo/Extender: Frann Rider in Treatment: 9 Encounter Discharge Information Items Discharge Pain Level: 0 Discharge Condition: Stable Ambulatory Status: Wheelchair Discharge Destination:  Home Private Transportation: Auto Accompanied By: dtr Schedule Follow-up Appointment: Yes Medication Reconciliation completed and No provided to Patient/Care Hephzibah Strehle: Clinical Summary of Care: Notes Patient has HBOT scheduled for 10/04/2017 Electronic Signature(s) Signed: 10/04/2017 11:40:43 AM By: Montey Hora Entered By: Montey Hora on 10/04/2017 11:40:42 Shari Turner ([id number]) -------------------------------------------------------------------------------- [place] Details Patient Name: Shari Turner Date of Service: 10/04/2017 10:00 AM Medical Record Number: [id number] Patient Account Number: [id number] Date of Birth/Sex: November 15, 1940 (76 y.o. Female) Treating RN: Montey Hora Primary Care Tania Perrott: Gershon Crane Other Clinician: Jacqulyn Bath Referring Derhonda Eastlick: Gershon Crane Treating Breylen Agyeman/Extender: Frann Rider in Treatment: 9 Vital Signs Time Taken: 09:17 Temperature (F): 98.5 Height (in): 62 Pulse (bpm): 90 Weight (lbs): 148 Respiratory Rate (breaths/min): 16 Body Mass Index (BMI): 27.1 Blood Pressure (mmHg): 118/50 Capillary Blood Glucose (mg/dl): 199 Reference Range: 80 - 120 mg / dl Electronic Signature(s) Signed: 10/04/2017 4:29:35 PM By: Montey Hora Entered By: Montey Hora on 10/04/2017 10:33:52

## 2017-10-05 NOTE — Progress Notes (Signed)
Shari, Turner ([id number]) Visit Report for 10/04/2017 HBO Details Patient Name: Shari Turner, Shari Turner Date of Service: 10/04/2017 10:00 AM Medical Record Number: [id number] Patient Account Number: [id number] Date of Birth/Sex: [birth year] (76 y.o. 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 HBO Treatment Details Treatment Number: 20 Patient Type: Outpatient Chamber Type: Monoplace Chamber Serial #: [id number] Treatment Protocol: 2.0 ATA with 90 minutes oxygen, and no air breaks Treatment Details Compression Rate Down: 1.5 psi / minute De-Compression Rate Up: 1.5 psi / minute Compress Tx Pressure Air breaks and breathing periods Decompress Decompress Begins Reached (leave unused spaces blank) Begins Ends Chamber Pressure (ATA) 1 2 - - - - - - 2 1 Clock Time (24 hr) 09:41 09:51 - - - - - - 11:21 11:31 Treatment Length: 110 (minutes) Treatment Segments: 4 Capillary Blood Glucose Pre Capillary Blood Glucose (mg/dl): Post Capillary Blood Glucose (mg/dl): Vital Signs Capillary Blood Glucose Reference Range: 80 - 120 mg / dl HBO Diabetic Blood Glucose Intervention Range: <131 mg/dl or >782 mg/dl Time Vitals Blood Respiratory Capillary Blood Glucose Pulse Action Type: Pulse: Temperature: Taken: Pressure: Rate: Glucose (mg/dl): Meter #: Oximetry (%) Taken: Pre 09:17 118/50 90 16 98.5 199 Post 11:36 138/64 56 16 98 164 Treatment Response Treatment Completion Status: Treatment Completed without Adverse Event HBO Attestation I certify that I supervised this HBO treatment in accordance with Medicare guidelines. A  trained emergency response Yes team is readily available per hospital policies and procedures. Continue HBOT as ordered. Yes Electronic Signature(s) NEARIAH, LUYSTER ([id number]) Signed: 10/04/2017 11:46:22 AM By: [name] MD, FACS Entered By: [name] on 10/04/2017 11:46:21 Shari Turner ([id number]) -------------------------------------------------------------------------------- HBO Safety Checklist Details Patient Name: Shari Turner Date of Service: 10/04/2017 10:00 AM Medical Record Number: [id number] Patient Account Number: [id number] Date of Birth/Sex: 01-14-1941 (76 y.o. Female) Treating RN: Curtis Sites Primary Care Bryndon Cumbie: [name] Other Clinician: [name] Referring Maizey Menendez: [name] Treating Shannon Kirkendall/Extender: Rudene Re in Treatment: 9 HBO Safety Checklist Items Safety Checklist Consent Form Signed Patient voided / foley secured and emptied When did you last eato 10/04/2017 AM Last dose of injectable or oral agent 10/04/2017 AM Ostomy pouch emptied and vented if applicable NA All implantable devices assessed, documented and approved NA Intravenous access site secured and place NA Valuables secured Linens and cotton and cotton/polyester blend (less than 51% polyester) Personal oil-based products / skin lotions / body lotions removed Wigs or hairpieces removed NA Smoking or tobacco materials removed NA Books / newspapers / magazines / loose paper removed Cologne, aftershave, perfume and deodorant removed Jewelry removed (may wrap wedding band) Make-up removed Hair care products removed Battery operated devices (external) removed Heating patches and chemical warmers removed Titanium eyewear removed NA Nail polish cured greater than 10 hours NA Casting material cured greater than 10 hours NA Hearing aids removed NA Loose dentures or partials removed NA Prosthetics have been removed NA Patient demonstrates  correct use of air break device (if applicable) Patient concerns have been addressed Patient grounding bracelet on and cord attached to chamber Specifics for Inpatients (complete in addition to above) Medication sheet sent with patient Intravenous medications needed or due during therapy sent with patient Drainage tubes (e.g. nasogastric tube or chest tube secured and vented)  Endotracheal or Tracheotomy tube secured Cuff deflated of air and inflated with saline Airway suctioned Electronic Signature(s) Signed: 10/04/2017 4:29:35 PM By: Bennett Scrape, [name] ([id number]) Entered By: Curtis Sites on 10/04/2017 10:35:22

## 2017-10-06 NOTE — Progress Notes (Signed)
KRISHIKA, BUGGE ([id number]) Visit Report for 10/05/2017 Arrival Information Details Patient Name: AOKI, WEDEMEYER Date of Service: 10/05/2017 10:00 AM Medical Record Number: [id number] Patient Account Number: [id number] Date of Birth/Sex: January 26, 1941 (76 y.o. Female) Treating RN: Primary Care Tona Qualley: Gershon Crane Other Clinician: Referring Hanifah Royse: Gershon Crane Treating Xayne Brumbaugh/Extender: Frann Rider in Treatment: 10 Visit Information History Since Last Visit Added or deleted any medications: No Patient Arrived: Wheel Chair Any new allergies or adverse reactions: No Arrival Time: 09:35 Had a fall or experienced change in No Accompanied By: daughter, Georgina Peer activities of daily living that may affect Transfer Assistance: Manual risk of falls: Patient Identification Verified: Yes Signs or symptoms of abuse/neglect since last visito No Secondary Verification Process Yes Hospitalized since last visit: No Completed: Pain Present Now: No Patient Has Alerts: Yes Patient Alerts: Patient on Blood Thinner DMII Eliquis Electronic Signature(s) Signed: 10/05/2017 3:11:53 PM By: Lorine Bears RCP, RRT, CHT Entered By: Lorine Bears on 10/05/2017 09:48:17 [name] ([id number]) -------------------------------------------------------------------------------- Vitals Details Patient Name: [name] Date of Service: 10/05/2017 10:00 AM Medical Record Number: [id number] Patient Account Number: [id number] Date of Birth/Sex: [birth year] (76 y.o. Female) Treating RN: Primary Care Erek Kowal: Gershon Crane Other Clinician: Referring Meigan Pates: Gershon Crane Treating Shontez Sermon/Extender: Frann Rider in Treatment: 10 Vital Signs Time Taken: 09:37 Temperature (F): 98.4 Height (in): 62 Pulse (bpm): 84 Weight (lbs): 148 Respiratory Rate (breaths/min): 16 Body Mass Index (BMI): 27.1 Blood Pressure (mmHg): 116/62 Capillary  Blood Glucose (mg/dl): 210 Reference Range: 80 - 120 mg / dl Electronic Signature(s) Signed: 10/05/2017 3:11:53 PM By: Lorine Bears RCP, RRT, CHT Entered By: Lorine Bears on 10/05/2017 09:48:58

## 2017-10-06 NOTE — Progress Notes (Signed)
ROHNDA, TRIMARCHI ([id number]) Visit Report for 10/05/2017 HBO Details Patient Name: Shari Turner, Shari Turner Date of Service: 10/05/2017 10:00 AM Medical Record Number: [id number] Patient Account Number: [id number] Date of Birth/Sex: 1941/05/17 (76 y.o. 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 HBO Treatment Details Treatment Number: 21 Patient Type: Outpatient Chamber Type: Monoplace Chamber Serial #: [id number] Treatment Protocol: 2.0 ATA with 90 minutes oxygen, and no air breaks Treatment Details Compression Rate Down: 1.5 psi / minute De-Compression Rate Up: 1.5 psi / minute Compress Tx Pressure Air breaks and breathing periods Decompress Decompress Begins Reached (leave unused spaces blank) Begins Ends Chamber Pressure (ATA) 1 2 - - - - - - 2 1 Clock Time (24 hr) 10:01 10:11 - - - - - - 11:41 11:51 Treatment Length: 110 (minutes) Treatment Segments: 4 Capillary Blood Glucose Pre Capillary Blood Glucose (mg/dl): Post Capillary Blood Glucose (mg/dl): Vital Signs Capillary Blood Glucose Reference Range: 80 - 120 mg / dl HBO Diabetic Blood Glucose Intervention Range: <131 mg/dl or >782 mg/dl Time Vitals Blood Respiratory Capillary Blood Glucose Pulse Action Type: Pulse: Temperature: Taken: Pressure: Rate: Glucose (mg/dl): Meter #: Oximetry (%) Taken: Pre 09:37 116/62 84 16 98.4 210 1 none per protocol Treatment Response Treatment Completion Status: Treatment Completed without Adverse Event Alezander Dimaano Notes The patient is doing very well and is halfway through the hyperbaric oxygen therapy treatment. symptomatically she feels much  better and has an upcoming MRI study to be done and is going to be followed up by her medical and surgical attendings. I would recommend she complete her course of hyperbaric oxygen therapy before evaluation for her final surgical decision HBO Attestation I certify that I supervised this HBO treatment in accordance with Medicare guidelines. A trained emergency response Yes team is readily available per hospital policies and procedures. Shari Turner, Shari Turner ([id number]) Continue HBOT as ordered. Yes Electronic Signature(s) Signed: 10/05/2017 12:15:44 PM By: [name] MD, FACS Entered By: [name] on 10/05/2017 12:15:44 Shari Turner ([id number]) -------------------------------------------------------------------------------- HBO Safety Checklist Details Patient Name: Shari Turner Date of Service: 10/05/2017 10:00 AM Medical Record Number: [id number] Patient Account Number: [id number] Date of Birth/Sex: 12/08/1940 (76 y.o. Female) Treating RN: Primary Care Errin Whitelaw: [name] Other Clinician: Referring Zayquan Bogard: [name] Treating Arelene Moroni/Extender: Rudene Re in Treatment: 10 HBO Safety Checklist Items Safety Checklist Consent Form Signed Patient voided / foley secured and emptied When did you last eato 10/05/17 am Last dose of injectable or oral agent 10/05/17 am Ostomy pouch emptied and vented if applicable NA All implantable devices assessed, documented and approved NA Intravenous access site secured and place NA Valuables secured Linens and cotton and cotton/polyester blend (less than 51% polyester) Personal oil-based products / skin lotions / body lotions removed Wigs or hairpieces removed NA Smoking or tobacco materials removed NA Books / newspapers / magazines / loose paper removed Cologne, aftershave, perfume and deodorant removed Jewelry removed (may wrap wedding band) Make-up removed Hair care products removed Battery operated  devices (external) removed Heating patches and chemical warmers removed Titanium eyewear removed NA Nail polish cured greater than 10 hours NA Casting material cured greater than 10 hours NA Hearing aids removed NA Loose dentures or partials removed NA Prosthetics have been removed NA Patient demonstrates correct use of air break  device (if applicable) Patient concerns have been addressed Patient grounding bracelet on and cord attached to chamber Specifics for Inpatients (complete in addition to above) Medication sheet sent with patient Intravenous medications needed or due during therapy sent with patient Drainage tubes (e.g. nasogastric tube or chest tube secured and vented) Endotracheal or Tracheotomy tube secured Cuff deflated of air and inflated with saline Airway suctioned Electronic Signature(s) Signed: 10/05/2017 3:11:53 PM By: Dayton Martes RCP, RRT, CHT [place], Shari Turner ([id number]) Entered By: Dayton Martes on 10/05/2017 09:49:55

## 2017-10-08 ENCOUNTER — Encounter: Payer: Medicare Other | Admitting: Physician Assistant

## 2017-10-08 DIAGNOSIS — E11622 Type 2 diabetes mellitus with other skin ulcer: Secondary | ICD-10-CM | POA: Diagnosis not present

## 2017-10-08 LAB — GLUCOSE, CAPILLARY
GLUCOSE-CAPILLARY: 149 mg/dL — AB (ref 65–99)
Glucose-Capillary: 217 mg/dL — ABNORMAL HIGH (ref 65–99)

## 2017-10-08 NOTE — Progress Notes (Signed)
Shari Turner, Shari Turner ([id number]) Visit Report for 10/08/2017 Arrival Information Details Patient Name: Shari Turner, Shari Turner Date of Service: 10/08/2017 10:00 AM Medical Record Number: [id number] Patient Account Number: [id number] Date of Birth/Sex: [birth year] (76 y.o. Female) Treating RN: Primary Care Domanique Luckett: Gershon Crane Other Clinician: Jacqulyn Bath Referring Cadynce Garrette: Gershon Crane Treating Leotha Voeltz/Extender: Melburn Hake, [name] Weeks in Treatment: 10 Visit Information History Since Last Visit Added or deleted any medications: No Patient Arrived: Wheel Chair Any new allergies or adverse reactions: No Arrival Time: 09:30 Had a fall or experienced change in No Accompanied By: granddaughter, [name] activities of daily living that may affect Transfer Assistance: Manual risk of falls: Patient Identification Verified: Yes Signs or symptoms of abuse/neglect since last visito No Secondary Verification Process Yes Hospitalized since last visit: No Completed: Pain Present Now: No Patient Has Alerts: Yes Patient Alerts: Patient on Blood Thinner DMII Eliquis Electronic Signature(s) Signed: 10/08/2017 12:06:37 PM By: Lorine Bears RCP, RRT, CHT Entered By: Lorine Bears on 10/08/2017 09:37:38 Shari Turner ([id number]) -------------------------------------------------------------------------------- Encounter Discharge Information Details Patient Name: Shari Turner Date of Service: 10/08/2017 10:00 AM Medical Record Number: [id number] Patient Account Number: [id number] Date of Birth/Sex: [birth year] (76 y.o. Female) Treating RN: Primary Care Brittni Hult: Gershon Crane Other Clinician: Jacqulyn Bath Referring Jurrell Royster: Gershon Crane Treating Liahm Grivas/Extender: Melburn Hake, [name] Weeks in Treatment: 10 Encounter Discharge Information Items Discharge Pain Level: 0 Discharge Condition: Stable Ambulatory Status: Wheelchair Discharge Destination:  Home Transportation: Private Auto granddaughter, Accompanied By: Arby Barrette Schedule Follow-up Appointment: No Medication Reconciliation completed and No provided to Patient/Care Bristal Steffy: Clinical Summary of Care: Notes Patient has an HBO treatment scheduled on 10/10/17 at 10:00 am. Electronic Signature(s) Signed: 10/08/2017 12:06:37 PM By: Lorine Bears RCP, RRT, CHT Entered By: Lorine Bears on 10/08/2017 12:06:22 Shari Turner ([id number]) -------------------------------------------------------------------------------- Vitals Details Patient Name: Shari Turner Date of Service: 10/08/2017 10:00 AM Medical Record Number: [id number] Patient Account Number: [id number] Date of Birth/Sex: 15-Oct-1940 (76 y.o. Female) Treating RN: Primary Care Kekoa Fyock: Gershon Crane Other Clinician: Jacqulyn Bath Referring Claudean Leavelle: Gershon Crane Treating Alysia Scism/Extender: Melburn Hake, [name] Weeks in Treatment: 10 Vital Signs Time Taken: 09:32 Temperature (F): 98.4 Height (in): 62 Pulse (bpm): 72 Weight (lbs): 148 Respiratory Rate (breaths/min): 16 Body Mass Index (BMI): 27.1 Blood Pressure (mmHg): 122/58 Capillary Blood Glucose (mg/dl): 217 Reference Range: 80 - 120 mg / dl Electronic Signature(s) Signed: 10/08/2017 12:06:37 PM By: Lorine Bears RCP, RRT, CHT Entered By: Lorine Bears on 10/08/2017 09:38:05

## 2017-10-10 ENCOUNTER — Encounter: Payer: Medicare Other | Attending: Internal Medicine | Admitting: Internal Medicine

## 2017-10-10 DIAGNOSIS — N3040 Irradiation cystitis without hematuria: Secondary | ICD-10-CM | POA: Insufficient documentation

## 2017-10-10 DIAGNOSIS — M8668 Other chronic osteomyelitis, other site: Secondary | ICD-10-CM | POA: Diagnosis not present

## 2017-10-10 DIAGNOSIS — E11622 Type 2 diabetes mellitus with other skin ulcer: Secondary | ICD-10-CM | POA: Insufficient documentation

## 2017-10-10 DIAGNOSIS — L599 Disorder of the skin and subcutaneous tissue related to radiation, unspecified: Secondary | ICD-10-CM | POA: Diagnosis not present

## 2017-10-10 DIAGNOSIS — K520 Gastroenteritis and colitis due to radiation: Secondary | ICD-10-CM | POA: Diagnosis not present

## 2017-10-10 LAB — GLUCOSE, CAPILLARY
Glucose-Capillary: 183 mg/dL — ABNORMAL HIGH (ref 65–99)
Glucose-Capillary: 151 mg/dL — ABNORMAL HIGH (ref 65–99)

## 2017-10-10 NOTE — Progress Notes (Signed)
Shari Turner ([id number]) Visit Report for 10/08/2017 HBO Details Patient Name: Shari Turner, Shari Turner Date of Service: 10/08/2017 10:00 AM Medical Record Number: [id number] Patient Account Number: [id number] Date of Birth/Sex: 1941-10-02 (77 y.o. 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 HBO Treatment Details Treatment Number: 22 Patient Type: Outpatient Chamber Type: Monoplace Chamber Serial #: [id number] Treatment Protocol: 2.0 ATA with 90 minutes oxygen, and no air breaks Treatment Details Compression Rate Down: 1.5 psi / minute De-Compression Rate Up: 1.5 psi / minute Compress Tx Pressure Air breaks and breathing periods Decompress Decompress Begins Reached (leave unused spaces blank) Begins Ends Chamber Pressure (ATA) 1 2 - - - - - - 2 1 Clock Time (24 hr) 09:54 10:05 - - - - - - 11:35 11:45 Treatment Length: 111 (minutes) Treatment Segments: 4 Capillary Blood Glucose Pre Capillary Blood Glucose (mg/dl): Post Capillary Blood Glucose (mg/dl): Vital Signs Capillary Blood Glucose Reference Range: 80 - 120 mg / dl HBO Diabetic Blood Glucose Intervention Range: <131 mg/dl or >387 mg/dl Time Vitals Blood Respiratory Capillary Blood Glucose Pulse Action Type: Pulse: Temperature: Taken: Pressure: Rate: Glucose (mg/dl): Meter #: Oximetry (%) Taken: Pre 09:32 122/58 72 16 98.4 217 1 none per protocol Post 11:50 120/60 66 16 97.7 149 1 none per protocol Treatment Response Treatment Toleration: Well Treatment Completion Treatment Completed without Adverse Event Status: Ajeet Casasola Notes Patient has been tolerating  HBO therapy without complication. HBO Attestation I certify that I supervised this HBO treatment in accordance with Medicare guidelines. A trained emergency response Yes team is readily available per hospital policies and procedures. ALIZAYA, MENZA ([id number]) Continue HBOT as ordered. Yes Electronic Signature(s) Signed: 10/10/2017 11:24:19 AM By: [name] PA-C Previous Signature: 10/08/2017 12:06:37 PM Version By: Dayton Martes RCP, RRT, CHT Entered By: [name] on 10/08/2017 15:50:24 Shari Turner ([id number]) -------------------------------------------------------------------------------- HBO Safety Checklist Details Patient Name: Shari Turner Date of Service: 10/08/2017 10:00 AM Medical Record Number: [id number] Patient Account Number: [id number] Date of Birth/Sex: 24-Jun-1941 (77 y.o. Female) Treating RN: Primary Care Kathee Tumlin: [name] Other Clinician: [name] Referring Andreal Vultaggio: [name] Treating Novella Abraha/Extender: [name], [name] Weeks in Treatment: 10 HBO Safety Checklist Items Safety Checklist Consent Form Signed Patient voided / foley secured and emptied When did you last eato 10/08/17 am Last dose of injectable or oral agent 10/08/17 am Ostomy pouch emptied and vented if applicable NA All implantable devices assessed, documented and approved NA Intravenous access site secured and place NA Valuables secured Linens and cotton and cotton/polyester blend (less than 51% polyester) Personal oil-based products / skin lotions / body lotions removed Wigs or hairpieces removed NA Smoking or tobacco materials removed NA Books / newspapers / magazines / loose paper removed Cologne, aftershave, perfume and deodorant removed Jewelry removed (may wrap wedding band) Make-up removed Hair care products removed Battery operated devices (external) removed Heating patches and chemical warmers removed Titanium eyewear  removed NA Nail polish cured greater than 10 hours NA Casting material cured greater than 10 hours NA Hearing aids removed NA Loose dentures or partials removed NA Prosthetics have been removed NA Patient demonstrates correct use of air break device (if applicable) Patient concerns have been addressed Patient grounding bracelet on and cord attached to chamber Specifics  for Inpatients (complete in addition to above) Medication sheet sent with patient Intravenous medications needed or due during therapy sent with patient Drainage tubes (e.g. nasogastric tube or chest tube secured and vented) Endotracheal or Tracheotomy tube secured Cuff deflated of air and inflated with saline Airway suctioned Electronic Signature(s) Signed: 10/08/2017 12:06:37 PM By: Dayton Martes RCP, RRT, CHT [place], [name] ([id number]) Entered By: Dayton Martes on 10/08/2017 09:38:54

## 2017-10-10 NOTE — Progress Notes (Signed)
ARNOLA, CRITTENDON ([id number]) Visit Report for 10/08/2017 Problem List Details Patient Name: Shari Turner, Shari Turner Date of Service: 10/08/2017 10:00 AM Medical Record Number: [id number] Patient Account Number: [id number] Date of Birth/Sex: 1941/01/02 (76 y.o. 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

## 2017-10-10 NOTE — Progress Notes (Signed)
Shari Turner, Shari Turner ([id number]) Visit Report for 10/10/2017 Arrival Information Details Patient Name: Shari Turner, Shari Turner Date of Service: 10/10/2017 10:00 AM Medical Record Number: [id number] Patient Account Number: [id number] Date of Birth/Sex: August 13, 1941 (77 y.o. Female) Treating RN: Primary Care Lason Eveland: Gershon Crane Other Clinician: Jacqulyn Bath Referring Andelyn Spade: Gershon Crane Treating Arath Kaigler/Extender: Tito Dine in Treatment: 10 Visit Information History Since Last Visit Added or deleted any medications: No Patient Arrived: Wheel Chair Any new allergies or adverse reactions: No Arrival Time: 09:45 Had a fall or experienced change in No Accompanied By: daughter, Georgina Peer activities of daily living that may affect Transfer Assistance: Manual risk of falls: Patient Identification Verified: Yes Signs or symptoms of abuse/neglect since last visito No Secondary Verification Process Yes Hospitalized since last visit: No Completed: Pain Present Now: No Patient Has Alerts: Yes Patient Alerts: Patient on Blood Thinner DMII Eliquis Electronic Signature(s) Signed: 10/10/2017 3:02:28 PM By: Lorine Bears RCP, RRT, CHT Entered By: Lorine Bears on 10/10/2017 10:14:52 Shari Turner ([id number]) -------------------------------------------------------------------------------- Encounter Discharge Information Details Patient Name: Shari Turner Date of Service: 10/10/2017 10:00 AM Medical Record Number: [id number] Patient Account Number: [id number] Date of Birth/Sex: 03-Oct-1941 (77 y.o. Female) Treating RN: Primary Care Symir Mah: Gershon Crane Other Clinician: Jacqulyn Bath Referring Samarth Ogle: Gershon Crane Treating Cherell Colvin/Extender: Tito Dine in Treatment: 10 Encounter Discharge Information Items Discharge Pain Level: 0 Discharge Condition: Stable Ambulatory Status: Wheelchair Discharge Destination:  Home Transportation: Private Auto daughter, Accompanied By: Georgina Peer Schedule Follow-up Appointment: No Medication Reconciliation completed and No provided to Patient/Care Goodwin [place]: Clinical Summary of Care: Notes Patient has an HBO treatment scheduled on 10/11/17 at 10:00 am. Electronic Signature(s) Signed: 10/10/2017 3:02:28 PM By: Lorine Bears RCP, RRT, CHT Entered By: Lorine Bears on 10/10/2017 12:15:59 Shari Turner ([id number]) -------------------------------------------------------------------------------- Vitals Details Patient Name: Shari Turner Date of Service: 10/10/2017 10:00 AM Medical Record Number: [id number] Patient Account Number: [id number] Date of Birth/Sex: 10-13-1940 (77 y.o. Female) Treating RN: Primary Care Kendry Pfarr: Gershon Crane Other Clinician: Jacqulyn Bath Referring Mileidy Atkin: Gershon Crane Treating Levell Tavano/Extender: Tito Dine in Treatment: 10 Vital Signs Time Taken: 09:46 Temperature (F): 98.1 Height (in): 62 Pulse (bpm): 72 Weight (lbs): 148 Respiratory Rate (breaths/min): 16 Body Mass Index (BMI): 27.1 Blood Pressure (mmHg): 130/58 Capillary Blood Glucose (mg/dl): 183 Reference Range: 80 - 120 mg / dl Electronic Signature(s) Signed: 10/10/2017 3:02:28 PM By: Lorine Bears RCP, RRT, CHT Entered By: Lorine Bears on 10/10/2017 10:15:21

## 2017-10-11 ENCOUNTER — Encounter: Payer: Medicare Other | Admitting: Physician Assistant

## 2017-10-11 DIAGNOSIS — E11622 Type 2 diabetes mellitus with other skin ulcer: Secondary | ICD-10-CM | POA: Diagnosis not present

## 2017-10-11 LAB — GLUCOSE, CAPILLARY
Glucose-Capillary: 189 mg/dL — ABNORMAL HIGH (ref 65–99)
Glucose-Capillary: 179 mg/dL — ABNORMAL HIGH (ref 65–99)

## 2017-10-11 NOTE — Progress Notes (Signed)
KATEENA, MCCLINE ([id number]) Visit Report for 10/10/2017 HBO Details Patient Name: Shari Turner, Shari Turner Date of Service: 10/10/2017 10:00 AM Medical Record Number: [id number] Patient Account Number: [id number] Date of Birth/Sex: Jan 31, 1941 (77 y.o. 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 HBO Treatment Details Treatment Number: 23 Patient Type: Outpatient Chamber Type: Monoplace Chamber Serial #: [id number] Treatment Protocol: 2.0 ATA with 90 minutes oxygen, and no air breaks Treatment Details Compression Rate Down: 1.5 psi / minute De-Compression Rate Up: 1.5 psi / minute Compress Tx Pressure Air breaks and breathing periods Decompress Decompress Begins Reached (leave unused spaces blank) Begins Ends Chamber Pressure (ATA) 1 2 - - - - - - 2 1 Clock Time (24 hr) 10:08 10:18 - - - - - - 11:48 11:58 Treatment Length: 110 (minutes) Treatment Segments: 4 Capillary Blood Glucose Pre Capillary Blood Glucose (mg/dl): Post Capillary Blood Glucose (mg/dl): Vital Signs Capillary Blood Glucose Reference Range: 80 - 120 mg / dl HBO Diabetic Blood Glucose Intervention Range: <131 mg/dl or >259 mg/dl Time Vitals Blood Respiratory Capillary Blood Glucose Pulse Action Type: Pulse: Temperature: Taken: Pressure: Rate: Glucose (mg/dl): Meter #: Oximetry (%) Taken: Pre 09:46 130/58 72 16 98.1 183 1 none per protocol Post 12:03 112/72 72 16 97.9 151 1 none per protocol Treatment Response Treatment Completion Status: Treatment Completed without Adverse Event Rony Ratz Notes no concerns with treatment given HBO Attestation I certify  that I supervised this HBO treatment in accordance with Medicare guidelines. A trained emergency response Yes team is readily available per hospital policies and procedures. Continue HBOT as ordered. BERGAN, MOULTON ([id number]) Electronic Signature(s) Signed: 10/10/2017 5:48:27 PM By: Baltazar Najjar MD Entered By: Baltazar Najjar on 10/10/2017 12:46:41 [name] ([id number]) -------------------------------------------------------------------------------- HBO Safety Checklist Details Patient Name: [name] Date of Service: 10/10/2017 10:00 AM Medical Record Number: [id number] Patient Account Number: [id number] Date of Birth/Sex: 1941/02/19 (77 y.o. Female) Treating RN: Primary Care Rashay Barnette: [name] Other Clinician: [name] Referring Iain Sawchuk: [name] Treating Ranson Belluomini/Extender: Altamese [place] in Treatment: 10 HBO Safety Checklist Items Safety Checklist Consent Form Signed Patient voided / foley secured and emptied When did you last eato 10/10/17 am Last dose of injectable or oral agent 10/10/17 am Ostomy pouch emptied and vented if applicable NA All implantable devices assessed, documented and approved NA Intravenous access site secured and place NA Valuables secured Linens and cotton and cotton/polyester blend (less than 51% polyester) Personal oil-based products / skin lotions / body lotions removed Wigs or hairpieces removed NA Smoking or tobacco materials removed NA Books / newspapers / magazines / loose paper removed Cologne, aftershave, perfume and deodorant removed Jewelry removed (may wrap wedding band) Make-up removed Hair care products removed Battery operated devices (external) removed Heating patches and chemical warmers removed Titanium eyewear removed NA Nail polish cured greater than 10 hours NA Casting material cured greater than 10 hours NA Hearing aids removed NA Loose dentures or partials  removed NA Prosthetics have been removed NA Patient demonstrates correct use of air break device (if applicable) Patient concerns have been addressed Patient grounding bracelet on and cord attached to chamber Specifics for Inpatients (complete in addition to above) Medication sheet sent with patient Intravenous medications needed or due during therapy sent with  patient Drainage tubes (e.g. nasogastric tube or chest tube secured and vented) Endotracheal or Tracheotomy tube secured Cuff deflated of air and inflated with saline Airway suctioned Electronic Signature(s) Signed: 10/10/2017 3:02:28 PM By: Dayton Martes RCP, RRT, CHT [place], [name] ([id number]) Entered By: Dayton Martes on 10/10/2017 10:16:23

## 2017-10-12 ENCOUNTER — Encounter: Payer: Medicare Other | Admitting: Physician Assistant

## 2017-10-12 DIAGNOSIS — E11622 Type 2 diabetes mellitus with other skin ulcer: Secondary | ICD-10-CM | POA: Diagnosis not present

## 2017-10-12 LAB — GLUCOSE, CAPILLARY
GLUCOSE-CAPILLARY: 196 mg/dL — AB (ref 65–99)
GLUCOSE-CAPILLARY: 145 mg/dL — AB (ref 65–99)

## 2017-10-12 NOTE — Progress Notes (Signed)
Shari Turner, Shari Turner ([id number]) Visit Report for 10/11/2017 Arrival Information Details Patient Name: Shari Turner, Shari Turner Date of Service: 10/11/2017 10:00 AM Medical Record Number: [id number] Patient Account Number: [id number] Date of Birth/Sex: Feb 24, 1941 (76 y.o. Female) Treating RN: Primary Care Keimora Swartout: Gershon Crane Other Clinician: Jacqulyn Bath Referring Jayse Hodkinson: Gershon Crane Treating Cylah Fannin/Extender: Melburn Hake, [name] Weeks in Treatment: 10 Visit Information History Since Last Visit Added or deleted any medications: No Patient Arrived: Wheel Chair Any new allergies or adverse reactions: No Arrival Time: 09:48 Had a fall or experienced change in No Accompanied By: daughter, Georgina Peer activities of daily living that may affect Transfer Assistance: Manual risk of falls: Patient Identification Verified: Yes Signs or symptoms of abuse/neglect since last visito No Secondary Verification Process Yes Hospitalized since last visit: No Completed: Pain Present Now: No Patient Has Alerts: Yes Patient Alerts: Patient on Blood Thinner DMII Eliquis Electronic Signature(s) Signed: 10/11/2017 4:04:28 PM By: Lorine Bears RCP, RRT, CHT Entered By: Lorine Bears on 10/11/2017 10:12:44 Shari Turner ([id number]) -------------------------------------------------------------------------------- Encounter Discharge Information Details Patient Name: Shari Turner Date of Service: 10/11/2017 10:00 AM Medical Record Number: [id number] Patient Account Number: [id number] Date of Birth/Sex: 06-May-1941 (76 y.o. Female) Treating RN: Primary Care Jeily Guthridge: Gershon Crane Other Clinician: Jacqulyn Bath Referring Keilan Nichol: Gershon Crane Treating Arelene Moroni/Extender: Melburn Hake, [name] Weeks in Treatment: 10 Encounter Discharge Information Items Discharge Pain Level: 0 Discharge Condition: Stable Ambulatory Status: Wheelchair Discharge Destination:  Home Transportation: Private Auto daughter, Accompanied By: Georgina Peer Schedule Follow-up Appointment: No Medication Reconciliation completed and No provided to Patient/Care Tysean [place]: Clinical Summary of Care: Notes Patient has an HBO treatment scheduled on 10/12/17 at 10:00 am. Electronic Signature(s) Signed: 10/11/2017 4:04:28 PM By: Lorine Bears RCP, RRT, CHT Entered By: Lorine Bears on 10/11/2017 16:04:05 Shari Turner ([id number]) -------------------------------------------------------------------------------- Vitals Details Patient Name: Shari Turner Date of Service: 10/11/2017 10:00 AM Medical Record Number: [id number] Patient Account Number: [id number] Date of Birth/Sex: 1940/12/16 (76 y.o. Female) Treating RN: Primary Care Amberlin Utke: Gershon Crane Other Clinician: Jacqulyn Bath Referring Ragina Fenter: Gershon Crane Treating Burhan Barham/Extender: Melburn Hake, [name] Weeks in Treatment: 10 Vital Signs Time Taken: 09:50 Temperature (F): 98.1 Height (in): 62 Pulse (bpm): 78 Weight (lbs): 148 Respiratory Rate (breaths/min): 16 Body Mass Index (BMI): 27.1 Blood Pressure (mmHg): 124/56 Capillary Blood Glucose (mg/dl): 189 Reference Range: 80 - 120 mg / dl Electronic Signature(s) Signed: 10/11/2017 4:04:28 PM By: Lorine Bears RCP, RRT, CHT Entered By: Lorine Bears on 10/11/2017 10:16:10

## 2017-10-12 NOTE — Progress Notes (Signed)
JOSLYN, RAMOS ([id number]) Visit Report for 10/11/2017 Problem List Details Patient Name: TANGY, DROZDOWSKI Date of Service: 10/11/2017 10:00 AM Medical Record Number: [id number] Patient Account Number: [id number] Date of Birth/Sex: [birth year] (76 y.o. 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

## 2017-10-12 NOTE — Progress Notes (Signed)
Shari Turner ([id number]) Visit Report for 10/11/2017 HBO Details Patient Name: Shari Turner, Shari Turner Date of Service: 10/11/2017 10:00 AM Medical Record Number: [id number] Patient Account Number: [id number] Date of Birth/Sex: [birth year] (76 y.o. 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 HBO Treatment Details Treatment Number: 24 Patient Type: Outpatient Chamber Type: Monoplace Chamber Serial #: [id number] Treatment Protocol: 2.0 ATA with 90 minutes oxygen, and no air breaks Treatment Details Compression Rate Down: 1.5 psi / minute De-Compression Rate Up: 1.5 psi / minute Compress Tx Pressure Air breaks and breathing periods Decompress Decompress Begins Reached (leave unused spaces blank) Begins Ends Chamber Pressure (ATA) 1 2 - - - - - - 2 1 Clock Time (24 hr) 10:11 10:22 - - - - - - 11:52 12:02 Treatment Length: 111 (minutes) Treatment Segments: 4 Capillary Blood Glucose Pre Capillary Blood Glucose (mg/dl): Post Capillary Blood Glucose (mg/dl): Vital Signs Capillary Blood Glucose Reference Range: 80 - 120 mg / dl HBO Diabetic Blood Glucose Intervention Range: <131 mg/dl or >132 mg/dl Time Vitals Blood Respiratory Capillary Blood Glucose Pulse Action Type: Pulse: Temperature: Taken: Pressure: Rate: Glucose (mg/dl): Meter #: Oximetry (%) Taken: Pre 09:50 124/56 78 16 98.1 189 1 none per protocol Post 12:06 122/68 78 16 97.9 179 1 none per protocol Treatment Response Treatment Toleration: Well Treatment Completion Treatment Completed without Adverse Event Status: Shari Turner Notes Patient is tolerating HBO therapy  very well without any complication at this time. HBO Attestation I certify that I supervised this HBO treatment in accordance with Medicare guidelines. A trained emergency response Yes team is readily available per hospital policies and procedures. SHAKEYRA, JONG ([id number]) Continue HBOT as ordered. Yes Electronic Signature(s) Signed: 10/11/2017 6:11:24 PM By: [name] PA-C Previous Signature: 10/11/2017 4:04:28 PM Version By: Dayton Martes RCP, RRT, CHT Entered By: [name] on 10/11/2017 18:08:46 Shari Turner ([id number]) -------------------------------------------------------------------------------- HBO Safety Checklist Details Patient Name: Shari Turner Date of Service: 10/11/2017 10:00 AM Medical Record Number: [id number] Patient Account Number: [id number] Date of Birth/Sex: Dec 29, 1940 (76 y.o. Female) Treating RN: Primary Care Lisbet Busker: [name] Other Clinician: [name] Referring Avi Kerschner: [name] Treating Jerone Cudmore/Extender: [name], [name] Weeks in Treatment: 10 HBO Safety Checklist Items Safety Checklist Consent Form Signed Patient voided / foley secured and emptied When did you last eato 10/11/17 am Last dose of injectable or oral agent 10/11/17 am Ostomy pouch emptied and vented if applicable NA All implantable devices assessed, documented and approved NA Intravenous access site secured and place NA Valuables secured Linens and cotton and cotton/polyester blend (less than 51% polyester) Personal oil-based products / skin lotions / body lotions removed Wigs or hairpieces removed NA Smoking or tobacco materials removed NA Books / newspapers / magazines / loose paper removed Cologne, aftershave, perfume and deodorant removed Jewelry removed (may wrap wedding band) Make-up removed Hair care products removed Battery operated devices (external) removed Heating patches and chemical warmers removed Titanium eyewear  removed NA Nail polish cured greater than 10 hours NA Casting material cured greater than 10 hours NA Hearing aids removed NA Loose dentures or partials removed NA Prosthetics have been removed NA Patient demonstrates correct use of air break device (if applicable) Patient concerns have been addressed Patient grounding bracelet on and  cord attached to chamber Specifics for Inpatients (complete in addition to above) Medication sheet sent with patient Intravenous medications needed or due during therapy sent with patient Drainage tubes (e.g. nasogastric tube or chest tube secured and vented) Endotracheal or Tracheotomy tube secured Cuff deflated of air and inflated with saline Airway suctioned Electronic Signature(s) Signed: 10/11/2017 4:04:28 PM By: Dayton Martes RCP, RRT, CHT Welch, [name] ([id number]) Entered By: Dayton Martes on 10/11/2017 10:17:03

## 2017-10-12 NOTE — Progress Notes (Signed)
Shari Turner, Shari Turner ([id number]) Visit Report for 10/12/2017 Arrival Information Details Patient Name: Shari Turner, Shari Turner Date of Service: 10/12/2017 10:00 AM Medical Record Number: [id number] Patient Account Number: [id number] Date of Birth/Sex: 05-26-1941 (76 y.o. Female) Treating RN: Primary Care Nazariah Cadet: Gershon Crane Other Clinician: Jacqulyn Bath Referring Geno Sydnor: Gershon Crane Treating Francetta Ilg/Extender: Melburn Hake, [name] Weeks in Treatment: 11 Visit Information History Since Last Visit Added or deleted any medications: No Patient Arrived: Wheel Chair Any new allergies or adverse reactions: No Arrival Time: 09:41 Had a fall or experienced change in No Accompanied By: granddaughter, Arby Barrette activities of daily living that may affect Transfer Assistance: Manual risk of falls: Patient Identification Verified: Yes Signs or symptoms of abuse/neglect since last visito No Secondary Verification Process Yes Hospitalized since last visit: No Completed: Pain Present Now: No Patient Has Alerts: Yes Patient Alerts: Patient on Blood Thinner DMII Eliquis Electronic Signature(s) Signed: 10/12/2017 12:00:15 PM By: Lorine Bears RCP, RRT, CHT Entered By: Lorine Bears on 10/12/2017 09:43:33 Shari Turner ([id number]) -------------------------------------------------------------------------------- Encounter Discharge Information Details Patient Name: Shari Turner Date of Service: 10/12/2017 10:00 AM Medical Record Number: [id number] Patient Account Number: [id number] Date of Birth/Sex: 1941/07/06 (76 y.o. Female) Treating RN: Primary Care Chenise Mulvihill: Gershon Crane Other Clinician: Jacqulyn Bath Referring Aizley Stenseth: Gershon Crane Treating Daylene Vandenbosch/Extender: Melburn Hake, [name] Weeks in Treatment: 11 Encounter Discharge Information Items Discharge Pain Level: 0 Discharge Condition: Stable Ambulatory Status: Wheelchair Discharge Destination:  Home Transportation: Private Auto granddaughter, Accompanied By: Arby Barrette Schedule Follow-up Appointment: No Medication Reconciliation completed and No provided to Patient/Care Herman Fiero: Clinical Summary of Care: Notes Patient has an HBO treatment scheduled on 10/15/17 at 10:00 am. Electronic Signature(s) Signed: 10/12/2017 12:00:15 PM By: Lorine Bears RCP, RRT, CHT Entered By: Lorine Bears on 10/12/2017 12:00:00 Shari Turner ([id number]) -------------------------------------------------------------------------------- Vitals Details Patient Name: Shari Turner Date of Service: 10/12/2017 10:00 AM Medical Record Number: [id number] Patient Account Number: [id number] Date of Birth/Sex: [birth year] (76 y.o. Female) Treating RN: Primary Care Larrell Rapozo: Gershon Crane Other Clinician: Jacqulyn Bath Referring Kemari Narez: Gershon Crane Treating Shaia Porath/Extender: Melburn Hake, [name] Weeks in Treatment: 11 Vital Signs Time Taken: 09:18 Temperature (F): 98.5 Height (in): 62 Pulse (bpm): 66 Weight (lbs): 148 Respiratory Rate (breaths/min): 16 Body Mass Index (BMI): 27.1 Blood Pressure (mmHg): 122/60 Capillary Blood Glucose (mg/dl): 196 Reference Range: 80 - 120 mg / dl Electronic Signature(s) Signed: 10/12/2017 12:00:15 PM By: Lorine Bears RCP, RRT, CHT Entered By: Lorine Bears on 10/12/2017 09:45:50

## 2017-10-13 NOTE — Progress Notes (Signed)
LULIA, SCHRINER ([id number]) Visit Report for 10/12/2017 Problem List Details Patient Name: Shari Turner, Shari Turner Date of Service: 10/12/2017 10:00 AM Medical Record Number: [id number] Patient Account Number: [id number] Date of Birth/Sex: [birth year] (76 y.o. 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

## 2017-10-13 NOTE — Progress Notes (Signed)
Shari Turner ([id number]) Visit Report for 10/12/2017 HBO Details Patient Name: Shari Turner, Shari Turner Date of Service: 10/12/2017 10:00 AM Medical Record Number: [id number] Patient Account Number: [id number] Date of Birth/Sex: 06-Jan-1941 (76 y.o. 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 HBO Treatment Details Treatment Number: 25 Patient Type: Outpatient Chamber Type: Monoplace Chamber Serial #: [id number] Treatment Protocol: 2.0 ATA with 90 minutes oxygen, and no air breaks Treatment Details Compression Rate Down: 1.5 psi / minute De-Compression Rate Up: 1.5 psi / minute Compress Tx Pressure Air breaks and breathing periods Decompress Decompress Begins Reached (leave unused spaces blank) Begins Ends Chamber Pressure (ATA) 1 2 - - - - - - 2 1 Clock Time (24 hr) 09:41 09:51 - - - - - - 11:21 11:31 Treatment Length: 110 (minutes) Treatment Segments: 4 Capillary Blood Glucose Pre Capillary Blood Glucose (mg/dl): Post Capillary Blood Glucose (mg/dl): Vital Signs Capillary Blood Glucose Reference Range: 80 - 120 mg / dl HBO Diabetic Blood Glucose Intervention Range: <131 mg/dl or >518 mg/dl Time Vitals Blood Respiratory Capillary Blood Glucose Pulse Action Type: Pulse: Temperature: Taken: Pressure: Rate: Glucose (mg/dl): Meter #: Oximetry (%) Taken: Pre 09:18 122/60 66 16 98.5 196 1 none per protocol Post 11:36 110/70 78 16 97.8 145 1 none per protocol Treatment Response Treatment Toleration: Well Treatment Completion Treatment Completed without Adverse Event Status: Shari Turner Notes Patient continues to tolerate HBO  therapy very well at this point. She has no concerns. HBO Attestation I certify that I supervised this HBO treatment in accordance with Medicare guidelines. A trained emergency response Yes team is readily available per hospital policies and procedures. TIA, Shari Turner ([id number]) Continue HBOT as ordered. Yes Electronic Signature(s) Signed: 10/12/2017 7:56:36 PM By: Shari Kelp PA-C Previous Signature: 10/12/2017 12:00:15 PM Version By: Shari Turner Shari Turner Entered By: Shari Turner on 10/12/2017 19:55:53 Shari Turner ([id number]) -------------------------------------------------------------------------------- HBO Safety Checklist Details Patient Name: Shari Turner Date of Service: 10/12/2017 10:00 AM Medical Record Number: [id number] Patient Account Number: [id number] Date of Birth/Sex: 23-May-1941 (76 y.o. Female) Treating RN: Primary Care Shari Turner: [name] Other Clinician: Izetta Turner Referring Shari Turner: [name] Treating Destani Wamser/Extender: Shari Turner, Shari Turner in Treatment: 11 HBO Safety Checklist Items Safety Checklist Consent Form Signed Patient voided / foley secured and emptied When did you last eato 10/12/17 am Last dose of injectable or oral agent 10/12/17 am Ostomy pouch emptied and vented if applicable NA All implantable devices assessed, documented and approved NA Intravenous access site secured and place NA Valuables secured Linens and cotton and cotton/polyester blend (less than 51% polyester) Personal oil-based products / skin lotions / body lotions removed Wigs or hairpieces removed NA Smoking or tobacco materials removed NA Books / newspapers / magazines / loose paper removed Cologne, aftershave, perfume and deodorant removed Jewelry removed (may wrap wedding band) Make-up removed Hair care products removed Battery operated devices (external) removed Heating patches and chemical warmers removed Titanium  eyewear removed NA Nail polish cured greater than 10 hours NA Casting material cured greater than 10 hours NA Hearing aids removed NA Loose dentures or partials removed NA Prosthetics have been removed NA Patient demonstrates correct use of air break device (if applicable) Patient concerns have been addressed Patient grounding bracelet  on and cord attached to chamber Specifics for Inpatients (complete in addition to above) Medication sheet sent with patient Intravenous medications needed or due during therapy sent with patient Drainage tubes (e.g. nasogastric tube or chest tube secured and vented) Endotracheal or Tracheotomy tube secured Cuff deflated of air and inflated with saline Airway suctioned Electronic Signature(s) Signed: 10/12/2017 12:00:15 PM By: Shari Turner Shari Turner Shari Turner, Shari Turner ([id number]) Entered By: Shari Turner on 10/12/2017 09:47:27

## 2017-10-15 ENCOUNTER — Encounter: Payer: Medicare Other | Admitting: Physician Assistant

## 2017-10-15 DIAGNOSIS — E11622 Type 2 diabetes mellitus with other skin ulcer: Secondary | ICD-10-CM | POA: Diagnosis not present

## 2017-10-15 LAB — GLUCOSE, CAPILLARY
GLUCOSE-CAPILLARY: 162 mg/dL — AB (ref 65–99)
Glucose-Capillary: 156 mg/dL — ABNORMAL HIGH (ref 65–99)

## 2017-10-15 NOTE — Progress Notes (Signed)
ADELIZ, TONKINSON ([id number]) Visit Report for 10/15/2017 Arrival Information Details Patient Name: Shari Turner, Shari Turner Date of Service: 10/15/2017 10:00 AM Medical Record Number: [id number] Patient Account Number: [id number] Date of Birth/Sex: 1941/01/30 (76 y.o. Female) Treating RN: Montey Hora Primary Care Denaya Horn: Gershon Crane Other Clinician: Referring Ameira Alessandrini: Gershon Crane Treating Keyvin Rison/Extender: Melburn Hake, [name] Weeks in Treatment: 11 Visit Information History Since Last Visit Added or deleted any medications: No Patient Arrived: Wheel Chair Any new allergies or adverse reactions: No Arrival Time: 09:18 Had a fall or experienced change in No Accompanied By: granddaughter, [name] activities of daily living that may affect Transfer Assistance: Manual risk of falls: Patient Identification Verified: Yes Signs or symptoms of abuse/neglect since last visito No Secondary Verification Process Yes Hospitalized since last visit: No Completed: Pain Present Now: No Patient Has Alerts: Yes Patient Alerts: Patient on Blood Thinner DMII Eliquis Electronic Signature(s) Signed: 10/15/2017 12:00:51 PM By: Lorine Bears RCP, RRT, CHT Entered By: Lorine Bears on 10/15/2017 09:41:04 Shari Turner ([id number]) -------------------------------------------------------------------------------- Encounter Discharge Information Details Patient Name: Shari Turner Date of Service: 10/15/2017 10:00 AM Medical Record Number: [id number] Patient Account Number: [id number] Date of Birth/Sex: 1941-06-04 (76 y.o. Female) Treating RN: Montey Hora Primary Care Quaron Delacruz: Gershon Crane Other Clinician: Referring Shamika Pedregon: Gershon Crane Treating Linkin Vizzini/Extender: Melburn Hake, [name] Weeks in Treatment: 11 Encounter Discharge Information Items Discharge Pain Level: 0 Discharge Condition: Stable Ambulatory Status: Wheelchair Discharge Destination:  Home Transportation: Private Auto granddaughter, Accompanied By: Arby Barrette Schedule Follow-up Appointment: No Medication Reconciliation completed and No provided to Patient/Care Debbie Bellucci: Clinical Summary of Care: Notes Patient has an HBO treatment scheduled on 10/16/17 at 10:00 am. Electronic Signature(s) Signed: 10/15/2017 12:00:51 PM By: Lorine Bears RCP, RRT, CHT Entered By: Lorine Bears on 10/15/2017 12:00:35 Shari Turner ([id number]) -------------------------------------------------------------------------------- Vitals Details Patient Name: Shari Turner Date of Service: 10/15/2017 10:00 AM Medical Record Number: [id number] Patient Account Number: [id number] Date of Birth/Sex: 13-Oct-1940 (76 y.o. Female) Treating RN: Montey Hora Primary Care Zunairah Devers: Gershon Crane Other Clinician: Referring Adana Marik: Gershon Crane Treating Timeka Goette/Extender: STONE III, [name] Weeks in Treatment: 11 Vital Signs Time Taken: 09:19 Temperature (F): 98.1 Height (in): 62 Pulse (bpm): 72 Weight (lbs): 148 Respiratory Rate (breaths/min): 16 Body Mass Index (BMI): 27.1 Blood Pressure (mmHg): 126/62 Capillary Blood Glucose (mg/dl): 156 Reference Range: 80 - 120 mg / dl Electronic Signature(s) Signed: 10/15/2017 12:00:51 PM By: Lorine Bears RCP, RRT, CHT Entered By: Lorine Bears on 10/15/2017 09:41:33

## 2017-10-16 ENCOUNTER — Encounter: Payer: Medicare Other | Admitting: Physician Assistant

## 2017-10-16 DIAGNOSIS — E11622 Type 2 diabetes mellitus with other skin ulcer: Secondary | ICD-10-CM | POA: Diagnosis not present

## 2017-10-16 LAB — GLUCOSE, CAPILLARY
GLUCOSE-CAPILLARY: 230 mg/dL — AB (ref 65–99)
GLUCOSE-CAPILLARY: 166 mg/dL — AB (ref 65–99)

## 2017-10-16 NOTE — Progress Notes (Signed)
Shari Turner, Shari Turner ([id number]) Visit Report for 10/15/2017 Problem List Details Patient Name: Shari Turner, Shari Turner Date of Service: 10/15/2017 10:00 AM Medical Record Number: [id number] Patient Account Number: [id number] Date of Birth/Sex: June 08, 1941 (76 y.o. 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

## 2017-10-16 NOTE — Progress Notes (Signed)
THEADA, POEPPELMAN ([id number]) Visit Report for 10/15/2017 HBO Details Patient Name: Shari Turner, Shari Turner Date of Service: 10/15/2017 10:00 AM Medical Record Number: [id number] Patient Account Number: [id number] Date of Birth/Sex: Mar 17, 1941 (76 y.o. 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 HBO Treatment Details Treatment Number: 26 Patient Type: Outpatient Chamber Type: Monoplace Chamber Serial #: [id number] Treatment Protocol: 2.0 ATA with 90 minutes oxygen, and no air breaks Treatment Details Compression Rate Down: 1.5 psi / minute De-Compression Rate Up: 1.5 psi / minute Compress Tx Pressure Air breaks and breathing periods Decompress Decompress Begins Reached (leave unused spaces blank) Begins Ends Chamber Pressure (ATA) 1 2 - - - - - - 2 1 Clock Time (24 hr) 09:40 09:50 - - - - - - 11:20 11:30 Treatment Length: 110 (minutes) Treatment Segments: 4 Capillary Blood Glucose Pre Capillary Blood Glucose (mg/dl): Post Capillary Blood Glucose (mg/dl): Vital Signs Capillary Blood Glucose Reference Range: 80 - 120 mg / dl HBO Diabetic Blood Glucose Intervention Range: <131 mg/dl or >536 mg/dl Time Vitals Blood Respiratory Capillary Blood Glucose Pulse Action Type: Pulse: Temperature: Taken: Pressure: Rate: Glucose (mg/dl): Meter #: Oximetry (%) Taken: Pre 09:19 126/62 72 16 98.1 156 1 none per protocol Post 11:35 122/66 72 16 97.9 162 1 none per protocol Treatment Response Treatment Toleration: Well Treatment Completion Treatment Completed without Adverse Event Status: HBO Attestation I certify that I supervised this  HBO treatment in accordance with Medicare guidelines. A trained emergency response Yes team is readily available per hospital policies and procedures. Continue HBOT as ordered. MAHATHI, WIECHMAN ([id number]) Electronic Signature(s) Signed: 10/15/2017 5:30:29 PM By: [name] PA-C Previous Signature: 10/15/2017 12:00:51 PM Version By: Dayton Martes RCP, RRT, CHT Entered By: [name] on 10/15/2017 17:29:46 Shari Turner ([id number]) -------------------------------------------------------------------------------- HBO Safety Checklist Details Patient Name: Shari Turner Date of Service: 10/15/2017 10:00 AM Medical Record Number: [id number] Patient Account Number: [id number] Date of Birth/Sex: 1941-06-11 (76 y.o. Female) Treating RN: Curtis Sites Primary Care Corri Delapaz: [name] Other Clinician: Referring Hinda Lindor: [name] Treating Malli Falotico/Extender: [name], [name] Weeks in Treatment: 11 HBO Safety Checklist Items Safety Checklist Consent Form Signed Patient voided / foley secured and emptied When did you last eato 10/15/17 am Last dose of injectable or oral agent 10/15/17 am Ostomy pouch emptied and vented if applicable NA All implantable devices assessed, documented and approved NA Intravenous access site secured and place NA Valuables secured Linens and cotton and cotton/polyester blend (less than 51% polyester) Personal oil-based products / skin lotions / body lotions removed Wigs or hairpieces removed NA Smoking or tobacco materials removed NA Books / newspapers / magazines / loose paper removed Cologne, aftershave, perfume and deodorant removed Jewelry removed (may wrap wedding band) Make-up removed Hair care products removed Battery operated devices (external) removed Heating patches and chemical warmers removed Titanium eyewear removed NA Nail polish cured greater than 10 hours NA Casting material cured greater than 10  hours NA Hearing aids removed NA Loose dentures or partials removed NA Prosthetics have been removed NA Patient demonstrates correct use of air break device (if applicable) Patient concerns have been addressed Patient grounding bracelet on and cord attached to chamber Specifics for Inpatients (complete in addition to above) Medication sheet sent  with patient Intravenous medications needed or due during therapy sent with patient Drainage tubes (e.g. nasogastric tube or chest tube secured and vented) Endotracheal or Tracheotomy tube secured Cuff deflated of air and inflated with saline Airway suctioned Electronic Signature(s) Signed: 10/15/2017 12:00:51 PM By: Dayton Martes RCP, RRT, CHT [place], [name] ([id number]) Entered By: Dayton Martes on 10/15/2017 09:42:33

## 2017-10-17 ENCOUNTER — Encounter: Payer: Medicare Other | Admitting: Internal Medicine

## 2017-10-17 DIAGNOSIS — E11622 Type 2 diabetes mellitus with other skin ulcer: Secondary | ICD-10-CM | POA: Diagnosis not present

## 2017-10-17 LAB — GLUCOSE, CAPILLARY
Glucose-Capillary: 160 mg/dL — ABNORMAL HIGH (ref 65–99)
Glucose-Capillary: 172 mg/dL — ABNORMAL HIGH (ref 65–99)

## 2017-10-17 NOTE — Progress Notes (Signed)
JOELIE, SCHOU ([id number]) Visit Report for 10/16/2017 Arrival Information Details Patient Name: Shari Turner, Shari Turner Date of Service: 10/16/2017 10:00 AM Medical Record Number: [id number] Patient Account Number: [id number] Date of Birth/Sex: 1941/06/27 (76 y.o. Female) Treating RN: Montey Hora Primary Care Winslow Ederer: Gershon Crane Other Clinician: Jacqulyn Bath Referring Reha Martinovich: Gershon Crane Treating Kaidence Callaway/Extender: Melburn Hake, [name] Weeks in Treatment: 11 Visit Information History Since Last Visit Added or deleted any medications: No Patient Arrived: Wheel Chair Any new allergies or adverse reactions: No Arrival Time: 09:25 Had a fall or experienced change in No Accompanied By: grand daughter activities of daily living that may affect Transfer Assistance: None risk of falls: Patient Identification Verified: Yes Signs or symptoms of abuse/neglect since last visito No Secondary Verification Process Yes Hospitalized since last visit: No Completed: Pain Present Now: No Patient Has Alerts: Yes Patient Alerts: Patient on Blood Thinner DMII Eliquis Electronic Signature(s) Signed: 10/16/2017 4:56:10 PM By: Montey Hora Entered By: Montey Hora on 10/16/2017 10:06:31 Shari Turner ([id number]) -------------------------------------------------------------------------------- Encounter Discharge Information Details Patient Name: Shari Turner Date of Service: 10/16/2017 10:00 AM Medical Record Number: [id number] Patient Account Number: [id number] Date of Birth/Sex: 16-Apr-1941 (76 y.o. Female) Treating RN: Montey Hora Primary Care Zyanne Schumm: Gershon Crane Other Clinician: Jacqulyn Bath Referring Laquanta Hummel: Gershon Crane Treating Amear Strojny/Extender: Melburn Hake, [name] Weeks in Treatment: 11 Encounter Discharge Information Items Discharge Pain Level: 0 Discharge Condition: Stable Ambulatory Status: Wheelchair Discharge Destination: Home Transportation: Private  Auto grand Accompanied By: daughter Schedule Follow-up Appointment: Yes Medication Reconciliation completed and No provided to Patient/Care Tore Carreker: Clinical Summary of Care: Notes Patient has an HBO treatment scheduled on 10/17/17 at 10:00 am. Electronic Signature(s) Signed: 10/16/2017 4:56:10 PM By: Montey Hora Entered By: Montey Hora on 10/16/2017 12:01:33 Shari Turner ([id number]) -------------------------------------------------------------------------------- Vitals Details Patient Name: Shari Turner Date of Service: 10/16/2017 10:00 AM Medical Record Number: [id number] Patient Account Number: [id number] Date of Birth/Sex: 1941/05/08 (76 y.o. Female) Treating RN: Montey Hora Primary Care Sumiye Hirth: Gershon Crane Other Clinician: Jacqulyn Bath Referring Genean Adamski: Gershon Crane Treating Forney Kleinpeter/Extender: Melburn Hake, [name] Weeks in Treatment: 11 Vital Signs Time Taken: 09:28 Temperature (F): 98.6 Height (in): 62 Pulse (bpm): 72 Weight (lbs): 148 Respiratory Rate (breaths/min): 16 Body Mass Index (BMI): 27.1 Blood Pressure (mmHg): 120/62 Capillary Blood Glucose (mg/dl): 230 Reference Range: 80 - 120 mg / dl Electronic Signature(s) Signed: 10/16/2017 4:56:10 PM By: Montey Hora Entered By: Montey Hora on 10/16/2017 10:07:09

## 2017-10-17 NOTE — Progress Notes (Signed)
MALYNN, LUCY ([id number]) Visit Report for 10/16/2017 Problem List Details Patient Name: Shari Turner, Shari Turner Date of Service: 10/16/2017 10:00 AM Medical Record Number: [id number] Patient Account Number: [id number] Date of Birth/Sex: 04/20/1941 (76 y.o. 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, unspecified Facility Procedures CPT4  Code: 75449201 Description: (Facility Use Only) HBOT, full body chamber, 74min Modifier: Quantity: 4 Physician Procedures CPT4 Code: 0071219 Description: 75883 - WC PHYS HYPERBARIC OXYGEN THERAPY ICD-10 Diagnosis Description 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

## 2017-10-17 NOTE — Progress Notes (Signed)
Shari Turner, Shari Turner ([id number]) Visit Report for 10/16/2017 HBO Details Patient Name: Shari Turner, Shari Turner Date of Service: 10/16/2017 10:00 AM Medical Record Number: [id number] Patient Account Number: [id number] Date of Birth/Sex: 10-Apr-1941 (76 y.o. 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 HBO Treatment Details Treatment Number: 27 Patient Type: Outpatient Chamber Type: Monoplace Chamber Serial #: [id number] Treatment Protocol: 2.0 ATA with 90 minutes oxygen, and no air breaks Treatment Details Compression Rate Down: 1.5 psi / minute De-Compression Rate Up: 1.5 psi / minute Compress Tx Pressure Air breaks and breathing periods Decompress Decompress Begins Reached (leave unused spaces blank) Begins Ends Chamber Pressure (ATA) 1 2 - - - - - - 2 1 Clock Time (24 hr) 09:57 10:07 - - - - - - 11:37 11:47 Treatment Length: 110 (minutes) Treatment Segments: 4 Capillary Blood Glucose Pre Capillary Blood Glucose (mg/dl): Post Capillary Blood Glucose (mg/dl): Vital Signs Capillary Blood Glucose Reference Range: 80 - 120 mg / dl HBO Diabetic Blood Glucose Intervention Range: <131 mg/dl or >253 mg/dl Time Vitals Blood Respiratory Capillary Blood Glucose Pulse Action Type: Pulse: Temperature: Taken: Pressure: Rate: Glucose (mg/dl): Meter #: Oximetry (%) Taken: Pre 09:28 120/62 72 16 98.6 230 Post 11:54 144/72 78 16 98.3 166 Treatment Response Treatment Toleration: Well Treatment Completion Treatment Completed without Adverse Event Status: Shari Turner Notes Patient continues to tolerate the HBO therapy very  well HBO Attestation I certify that I supervised this HBO treatment in accordance with Medicare guidelines. A trained emergency response Yes team is readily available per hospital policies and procedures. Shari Turner, Shari Turner ([id number]) Continue HBOT as ordered. Yes Electronic Signature(s) Signed: 10/17/2017 8:32:41 AM By: [name] PA-C Previous Signature: 10/16/2017 4:56:10 PM Version By: Curtis Sites Entered By: [name] on 10/17/2017 08:08:13 Shari Turner ([id number]) -------------------------------------------------------------------------------- HBO Safety Checklist Details Patient Name: Shari Turner Date of Service: 10/16/2017 10:00 AM Medical Record Number: [id number] Patient Account Number: [id number] Date of Birth/Sex: 08-Dec-1940 (76 y.o. Female) Treating RN: Curtis Sites Primary Care Glenys Snader: [name] Other Clinician: [name] Referring Jelissa Espiritu: [name] Treating Paysley Poplar/Extender: [name], [name] Weeks in Treatment: 11 HBO Safety Checklist Items Safety Checklist Consent Form Signed Patient voided / foley secured and emptied When did you last eato 10/16/2017 AM Last dose of injectable or oral agent 10/16/2017 AM Ostomy pouch emptied and vented if applicable NA All implantable devices assessed, documented and approved NA Intravenous access site secured and place NA Valuables secured Linens and cotton and cotton/polyester blend (less than 51% polyester) Personal oil-based products / skin lotions / body lotions removed Wigs or hairpieces removed NA Smoking or tobacco materials removed NA Books / newspapers / magazines / loose paper removed Cologne, aftershave, perfume and deodorant removed Jewelry removed (may wrap wedding band) Make-up removed Hair care products removed Battery operated devices (external) removed Heating patches and chemical warmers removed Titanium eyewear removed NA Nail polish cured greater than 10  hours NA Casting material cured greater than 10 hours NA Hearing aids removed NA Loose dentures or partials removed NA Prosthetics have been removed NA Patient demonstrates correct use of air break device (if applicable) Patient concerns have been addressed Patient grounding bracelet on and cord attached to chamber Specifics for Inpatients (complete in addition to above)  Medication sheet sent with patient Intravenous medications needed or due during therapy sent with patient Drainage tubes (e.g. nasogastric tube or chest tube secured and vented) Endotracheal or Tracheotomy tube secured Cuff deflated of air and inflated with saline Airway suctioned Electronic Signature(s) Signed: 10/16/2017 4:56:10 PM By: Bennett Scrape, Shari Turner ([id number]) Entered By: Curtis Sites on 10/16/2017 10:09:43

## 2017-10-18 ENCOUNTER — Encounter: Payer: Medicare Other | Admitting: Physician Assistant

## 2017-10-18 DIAGNOSIS — E11622 Type 2 diabetes mellitus with other skin ulcer: Secondary | ICD-10-CM | POA: Diagnosis not present

## 2017-10-18 LAB — GLUCOSE, CAPILLARY
GLUCOSE-CAPILLARY: 144 mg/dL — AB (ref 65–99)
Glucose-Capillary: 149 mg/dL — ABNORMAL HIGH (ref 65–99)

## 2017-10-18 NOTE — Progress Notes (Signed)
Shari, Turner ([id number]) Visit Report for 10/17/2017 HBO Details Patient Name: Shari Turner, Shari Turner Date of Service: 10/17/2017 10:00 AM Medical Record Number: [id number] Patient Account Number: [id number] Date of Birth/Sex: 10/07/1941 (77 y.o. 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 HBO Treatment Details Treatment Number: 28 Patient Type: Outpatient Chamber Type: Monoplace Chamber Serial #: [id number] Treatment Protocol: 2.0 ATA with 90 minutes oxygen, and no air breaks Treatment Details Compression Rate Down: 1.5 psi / minute De-Compression Rate Up: 1.5 psi / minute Compress Tx Pressure Air breaks and breathing periods Decompress Decompress Begins Reached (leave unused spaces blank) Begins Ends Chamber Pressure (ATA) 1 2 - - - - - - 2 1 Clock Time (24 hr) 09:51 10:01 - - - - - - 11:31 11:41 Treatment Length: 110 (minutes) Treatment Segments: 4 Capillary Blood Glucose Pre Capillary Blood Glucose (mg/dl): Post Capillary Blood Glucose (mg/dl): Vital Signs Capillary Blood Glucose Reference Range: 80 - 120 mg / dl HBO Diabetic Blood Glucose Intervention Range: <131 mg/dl or >782 mg/dl Time Vitals Blood Respiratory Capillary Blood Glucose Pulse Action Type: Pulse: Temperature: Taken: Pressure: Rate: Glucose (mg/dl): Meter #: Oximetry (%) Taken: Pre 09:30 102/64 72 16 98 160 1 none per protocol Post 11:45 102/60 72 16 97.9 172 1 none per protocol Treatment Response Treatment Completion Status: Treatment Completed without Adverse Event Jatziri Goffredo Notes concerns with treatment given HBO Attestation I certify that I  supervised this HBO treatment in accordance with Medicare guidelines. A trained emergency response Yes team is readily available per hospital policies and procedures. Continue HBOT as ordered. EMMORY, LANMAN ([id number]) Electronic Signature(s) Signed: 10/17/2017 4:32:47 PM By: Baltazar Najjar MD Previous Signature: 10/17/2017 12:03:42 PM Version By: Dayton Martes RCP, RRT, CHT Entered By: Baltazar Najjar on 10/17/2017 16:32:02 [name] ([id number]) -------------------------------------------------------------------------------- HBO Safety Checklist Details Patient Name: [name] Date of Service: 10/17/2017 10:00 AM Medical Record Number: [id number] Patient Account Number: [id number] Date of Birth/Sex: 1941-07-05 (77 y.o. Female) Treating RN: Primary Care Caylynn Minchew: [name] Other Clinician: [name] Referring Juanna Pudlo: [name] Treating Mansi Tokar/Extender: Altamese [place] in Treatment: 11 HBO Safety Checklist Items Safety Checklist Consent Form Signed Patient voided / foley secured and emptied When did you last eato 10/17/17 am Last dose of injectable or oral agent 10/17/17 am Ostomy pouch emptied and vented if applicable NA All implantable devices assessed, documented and approved NA Intravenous access site secured and place NA Valuables secured Linens and cotton and cotton/polyester blend (less than 51% polyester) Personal oil-based products / skin lotions / body lotions removed Wigs or hairpieces removed NA Smoking or tobacco materials removed NA Books / newspapers / magazines / loose paper removed Cologne, aftershave, perfume and deodorant removed Jewelry removed (may wrap wedding band) Make-up removed Hair care products removed Battery operated devices (external) removed Heating patches and chemical warmers removed Titanium eyewear removed NA Nail polish cured greater than 10 hours NA Casting material cured  greater than 10 hours NA Hearing aids removed NA Loose dentures or partials removed NA Prosthetics have been removed NA Patient demonstrates correct use of air break device (if applicable) Patient concerns have been addressed Patient grounding bracelet on and cord attached to chamber Specifics for Inpatients (complete in addition to above) Medication sheet  sent with patient Intravenous medications needed or due during therapy sent with patient Drainage tubes (e.g. nasogastric tube or chest tube secured and vented) Endotracheal or Tracheotomy tube secured Cuff deflated of air and inflated with saline Airway suctioned Electronic Signature(s) Signed: 10/17/2017 12:03:42 PM By: Dayton Martes RCP, RRT, CHT [place], [name] ([id number]) Entered By: Dayton Martes on 10/17/2017 10:26:53

## 2017-10-18 NOTE — Progress Notes (Signed)
Shari Turner, Shari Turner ([id number]) Visit Report for 10/17/2017 Arrival Information Details Patient Name: Shari Turner, Shari Turner Date of Service: 10/17/2017 10:00 AM Medical Record Number: [id number] Patient Account Number: [id number] Date of Birth/Sex: Feb 12, 1941 (76 y.o. Female) Treating RN: Primary Care Marianna Cid: Gershon Crane Other Clinician: Jacqulyn Bath Referring Edrik Rundle: Gershon Crane Treating Quiera Diffee/Extender: Tito Dine in Treatment: 11 Visit Information History Since Last Visit Added or deleted any medications: No Patient Arrived: Wheel Chair Any new allergies or adverse reactions: No Arrival Time: 09:30 Had a fall or experienced change in No Accompanied By: granddaughter, [name] activities of daily living that may affect Transfer Assistance: Manual risk of falls: Patient Identification Verified: Yes Signs or symptoms of abuse/neglect since last visito No Secondary Verification Process Yes Hospitalized since last visit: No Completed: Pain Present Now: No Patient Has Alerts: Yes Patient Alerts: Patient on Blood Thinner DMII Eliquis Electronic Signature(s) Signed: 10/17/2017 12:03:42 PM By: Lorine Bears RCP, RRT, CHT Entered By: Lorine Bears on 10/17/2017 10:24:47 Shari Turner ([id number]) -------------------------------------------------------------------------------- Encounter Discharge Information Details Patient Name: Shari Turner Date of Service: 10/17/2017 10:00 AM Medical Record Number: [id number] Patient Account Number: [id number] Date of Birth/Sex: 17-Apr-1941 (76 y.o. Female) Treating RN: Primary Care Chaslyn Eisen: Gershon Crane Other Clinician: Jacqulyn Bath Referring Conna Terada: Gershon Crane Treating Errin Whitelaw/Extender: Tito Dine in Treatment: 11 Encounter Discharge Information Items Discharge Pain Level: 0 Discharge Condition: Stable Ambulatory Status: Wheelchair Discharge Destination:  Home Transportation: Private Auto granddaughter, Accompanied By: Arby Barrette Schedule Follow-up Appointment: No Medication Reconciliation completed and No provided to Patient/Care Anakaren Campion: Clinical Summary of Care: Notes Patient has an HBO treatment scheduled on 10/18/17 at 10:00 am. Electronic Signature(s) Signed: 10/17/2017 12:03:42 PM By: Lorine Bears RCP, RRT, CHT Entered By: Lorine Bears on 10/17/2017 12:03:24 Shari Turner ([id number]) -------------------------------------------------------------------------------- Vitals Details Patient Name: Shari Turner Date of Service: 10/17/2017 10:00 AM Medical Record Number: [id number] Patient Account Number: [id number] Date of Birth/Sex: 23-Nov-1940 (76 y.o. Female) Treating RN: Primary Care Caili Escalera: Gershon Crane Other Clinician: Jacqulyn Bath Referring Aprill Banko: Gershon Crane Treating Patches Mcdonnell/Extender: Tito Dine in Treatment: 11 Vital Signs Time Taken: 09:30 Temperature (F): 98.0 Height (in): 62 Pulse (bpm): 72 Weight (lbs): 148 Respiratory Rate (breaths/min): 16 Body Mass Index (BMI): 27.1 Blood Pressure (mmHg): 102/64 Capillary Blood Glucose (mg/dl): 160 Reference Range: 80 - 120 mg / dl Electronic Signature(s) Signed: 10/17/2017 12:03:42 PM By: Lorine Bears RCP, RRT, CHT Entered By: Lorine Bears on 10/17/2017 10:25:14

## 2017-10-19 ENCOUNTER — Encounter: Payer: Medicare Other | Admitting: Physician Assistant

## 2017-10-19 DIAGNOSIS — E11622 Type 2 diabetes mellitus with other skin ulcer: Secondary | ICD-10-CM | POA: Diagnosis not present

## 2017-10-19 LAB — GLUCOSE, CAPILLARY
GLUCOSE-CAPILLARY: 156 mg/dL — AB (ref 65–99)
Glucose-Capillary: 151 mg/dL — ABNORMAL HIGH (ref 65–99)

## 2017-10-19 NOTE — Progress Notes (Signed)
ROSSANNA, SPITZLEY ([id number]) Visit Report for 10/18/2017 Arrival Information Details Patient Name: Shari Turner, Shari Turner Date of Service: 10/18/2017 10:00 AM Medical Record Number: [id number] Patient Account Number: [id number] Date of Birth/Sex: June 24, 1941 (77 y.o. Female) Treating RN: Primary Care Harshith Pursell: Gershon Crane Other Clinician: Jacqulyn Bath Referring Hoorain Kozakiewicz: Gershon Crane Treating Akshat Minehart/Extender: Melburn Hake, [name] Weeks in Treatment: 11 Visit Information History Since Last Visit Added or deleted any medications: No Patient Arrived: Wheel Chair Any new allergies or adverse reactions: No Arrival Time: 09:12 Had a fall or experienced change in No Accompanied By: granddaughter, [name] activities of daily living that may affect Transfer Assistance: Stretcher risk of falls: Patient Identification Verified: Yes Signs or symptoms of abuse/neglect since last visito No Secondary Verification Process Yes Hospitalized since last visit: No Completed: Pain Present Now: No Patient Has Alerts: Yes Patient Alerts: Patient on Blood Thinner DMII Eliquis Electronic Signature(s) Signed: 10/18/2017 11:53:01 AM By: Lorine Bears RCP, RRT, CHT Entered By: Lorine Bears on 10/18/2017 09:26:53 Shari Turner ([id number]) -------------------------------------------------------------------------------- Encounter Discharge Information Details Patient Name: Shari Turner Date of Service: 10/18/2017 10:00 AM Medical Record Number: [id number] Patient Account Number: [id number] Date of Birth/Sex: 1941/02/06 (77 y.o. Female) Treating RN: Primary Care Latania Bascomb: Gershon Crane Other Clinician: Jacqulyn Bath Referring Dantavious Snowball: Gershon Crane Treating Laelia Angelo/Extender: Melburn Hake, [name] Weeks in Treatment: 11 Encounter Discharge Information Items Discharge Pain Level: 0 Discharge Condition: Stable Ambulatory Status: Wheelchair Discharge Destination:  Home Transportation: Private Auto granddaughter, Accompanied By: Arby Barrette Schedule Follow-up Appointment: No Medication Reconciliation completed and No provided to Patient/Care Wilbert Schouten: Clinical Summary of Care: Notes Patient has an HBO treatment scheduled on 10/19/17 at 10:00 am. Electronic Signature(s) Signed: 10/18/2017 11:53:01 AM By: Lorine Bears RCP, RRT, CHT Entered By: Lorine Bears on 10/18/2017 11:52:44 Shari Turner ([id number]) -------------------------------------------------------------------------------- Vitals Details Patient Name: Shari Turner Date of Service: 10/18/2017 10:00 AM Medical Record Number: [id number] Patient Account Number: [id number] Date of Birth/Sex: 22-May-1941 (77 y.o. Female) Treating RN: Primary Care Sherylann Vangorden: Gershon Crane Other Clinician: Jacqulyn Bath Referring Otho Michalik: Gershon Crane Treating Easten Maceachern/Extender: Melburn Hake, [name] Weeks in Treatment: 11 Vital Signs Time Taken: 09:14 Temperature (F): 97.9 Height (in): 62 Pulse (bpm): 78 Weight (lbs): 148 Respiratory Rate (breaths/min): 16 Body Mass Index (BMI): 27.1 Blood Pressure (mmHg): 124/66 Capillary Blood Glucose (mg/dl): 144 Reference Range: 80 - 120 mg / dl Electronic Signature(s) Signed: 10/18/2017 11:53:01 AM By: Lorine Bears RCP, RRT, CHT Entered By: Becky Sax, Amado Nash on 10/18/2017 09:27:18

## 2017-10-20 NOTE — Progress Notes (Signed)
ERCEL, STROH ([id number]) Visit Report for 10/19/2017 HBO Details Patient Name: Shari Turner, Shari Turner Date of Service: 10/19/2017 10:00 AM Medical Record Number: [id number] Patient Account Number: [id number] Date of Birth/Sex: 1941-06-29 (76 y.o. 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 HBO Treatment Details Treatment Number: 30 Patient Type: Outpatient Chamber Type: Monoplace Chamber Serial #: [id number] Treatment Protocol: 2.0 ATA with 90 minutes oxygen, and no air breaks Treatment Details Compression Rate Down: 1.5 psi / minute De-Compression Rate Up: 1.5 psi / minute Compress Tx Pressure Air breaks and breathing periods Decompress Decompress Begins Reached (leave unused spaces blank) Begins Ends Chamber Pressure (ATA) 1 2 - - - - - - 2 1 Clock Time (24 hr) 09:42 09:52 - - - - - - 11:22 11:32 Treatment Length: 110 (minutes) Treatment Segments: 4 Capillary Blood Glucose Pre Capillary Blood Glucose (mg/dl): Post Capillary Blood Glucose (mg/dl): Vital Signs Capillary Blood Glucose Reference Range: 80 - 120 mg / dl HBO Diabetic Blood Glucose Intervention Range: <131 mg/dl or >782 mg/dl Time Vitals Blood Respiratory Capillary Blood Glucose Pulse Action Type: Pulse: Temperature: Taken: Pressure: Rate: Glucose (mg/dl): Meter #: Oximetry (%) Taken: Pre 09:17 122/64 72 16 97.8 156 1 none per protocol Post 11:37 112/68 72 16 98.1 151 1 none per protocol Treatment Response Treatment Toleration: Well Treatment Completion Treatment Completed without Adverse Event Status: HBO Attestation I certify that I supervised  this HBO treatment in accordance with Medicare guidelines. A trained emergency response Yes team is readily available per hospital policies and procedures. Continue HBOT as ordered. RAYYAN, CUE ([id number]) Electronic Signature(s) Signed: 10/19/2017 12:11:50 PM By: [name] PA-C Previous Signature: 10/19/2017 11:58:47 AM Version By: Dayton Martes RCP, RRT, CHT Entered By: [name] on 10/19/2017 12:11:50 Shari Turner ([id number]) -------------------------------------------------------------------------------- HBO Safety Checklist Details Patient Name: Shari Turner Date of Service: 10/19/2017 10:00 AM Medical Record Number: [id number] Patient Account Number: [id number] Date of Birth/Sex: 23-Nov-1940 (76 y.o. Female) Treating RN: Primary Care Chadric Kimberley: [name] Other Clinician: [name] Referring Jame Seelig: [name] Treating Bascom Biel/Extender: [name], [name] Weeks in Treatment: 12 HBO Safety Checklist Items Safety Checklist Consent Form Signed Patient voided / foley secured and emptied When did you last eato 10/19/17 am Last dose of injectable or oral agent 10/19/17 am Ostomy pouch emptied and vented if applicable NA All implantable devices assessed, documented and approved NA Intravenous access site secured and place NA Valuables secured Linens and cotton and cotton/polyester blend (less than 51% polyester) Personal oil-based products / skin lotions / body lotions removed Wigs or hairpieces removed NA Smoking or tobacco materials removed NA Books / newspapers / magazines / loose paper removed Cologne, aftershave, perfume and deodorant removed Jewelry removed (may wrap wedding band) Make-up removed Hair care products removed Battery operated devices (external) removed Heating patches and chemical warmers removed Titanium eyewear removed NA Nail polish cured greater than 10 hours NA Casting material cured greater  than 10 hours NA Hearing aids removed NA Loose dentures or partials removed NA Prosthetics have been removed NA Patient demonstrates correct use of air break device (if applicable) Patient concerns have been addressed Patient grounding bracelet on and cord attached to chamber Specifics for Inpatients (complete in addition to above) Medication sheet sent  with patient Intravenous medications needed or due during therapy sent with patient Drainage tubes (e.g. nasogastric tube or chest tube secured and vented) Endotracheal or Tracheotomy tube secured Cuff deflated of air and inflated with saline Airway suctioned Electronic Signature(s) Signed: 10/19/2017 11:58:47 AM By: Dayton Martes RCP, RRT, CHT [place], [name] ([id number]) Entered By: Dayton Martes on 10/19/2017 09:46:00

## 2017-10-20 NOTE — Progress Notes (Signed)
Shari Turner, Shari Turner ([id number]) Visit Report for 10/18/2017 HBO Details Patient Name: Shari Turner, Shari Turner Date of Service: 10/18/2017 10:00 AM Medical Record Number: [id number] Patient Account Number: [id number] Date of Birth/Sex: 04-10-1941 (76 y.o. 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 HBO Treatment Details Treatment Number: 29 Patient Type: Outpatient Chamber Type: Monoplace Chamber Serial #: [id number] Treatment Protocol: 2.0 ATA with 90 minutes oxygen, and no air breaks Treatment Details Compression Rate Down: 1.5 psi / minute De-Compression Rate Up: 1.5 psi / minute Compress Tx Pressure Air breaks and breathing periods Decompress Decompress Begins Reached (leave unused spaces blank) Begins Ends Chamber Pressure (ATA) 1 2 - - - - - - 2 1 Clock Time (24 hr) 09:37 09:47 - - - - - - 11:18 11:28 Treatment Length: 111 (minutes) Treatment Segments: 4 Capillary Blood Glucose Pre Capillary Blood Glucose (mg/dl): Post Capillary Blood Glucose (mg/dl): Vital Signs Capillary Blood Glucose Reference Range: 80 - 120 mg / dl HBO Diabetic Blood Glucose Intervention Range: <131 mg/dl or >782 mg/dl Time Vitals Blood Respiratory Capillary Blood Glucose Pulse Action Type: Pulse: Temperature: Taken: Pressure: Rate: Glucose (mg/dl): Meter #: Oximetry (%) Taken: Pre 09:14 124/66 78 16 97.9 144 1 none per protocol Post 11:33 118/70 66 16 97.6 149 1 none per protocol Treatment Response Treatment Toleration: Well Treatment Completion Treatment Completed without Adverse Event Status: Shari Turner Notes Patient continues to have no  issues or complications regarding HBO therapy. HBO Attestation I certify that I supervised this HBO treatment in accordance with Medicare guidelines. A trained emergency response Yes team is readily available per hospital policies and procedures. Shari Turner, Shari Turner ([id number]) Continue HBOT as ordered. Yes Electronic Signature(s) Signed: 10/19/2017 8:04:13 AM By: [name] PA-C Previous Signature: 10/18/2017 11:53:01 AM Version By: Dayton Martes RCP, RRT, CHT Entered By: [name] on 10/19/2017 08:04:13 Shari Turner ([id number]) -------------------------------------------------------------------------------- HBO Safety Checklist Details Patient Name: Shari Turner Date of Service: 10/18/2017 10:00 AM Medical Record Number: [id number] Patient Account Number: [id number] Date of Birth/Sex: 06-07-1941 (76 y.o. Female) Treating RN: Primary Care Mena Simonis: [name] Other Clinician: [name] Referring Shayne Deerman: [name] Treating Amillia Biffle/Extender: [name], [name] Weeks in Treatment: 11 HBO Safety Checklist Items Safety Checklist Consent Form Signed Patient voided / foley secured and emptied When did you last eato 10/18/17 am Last dose of injectable or oral agent 10/18/17 am Ostomy pouch emptied and vented if applicable NA All implantable devices assessed, documented and approved NA Intravenous access site secured and place NA Valuables secured Linens and cotton and cotton/polyester blend (less than 51% polyester) Personal oil-based products / skin lotions / body lotions removed Wigs or hairpieces removed NA Smoking or tobacco materials removed NA Books / newspapers / magazines / loose paper removed Cologne, aftershave, perfume and deodorant removed Jewelry removed (may wrap wedding band) Make-up removed Hair care products removed Battery operated devices (external) removed Heating patches and chemical warmers removed Titanium  eyewear removed NA Nail polish cured greater than 10 hours NA Casting material cured greater than 10 hours NA Hearing aids removed NA Loose dentures or partials removed NA Prosthetics have been removed NA Patient demonstrates correct use of air break device (if applicable) Patient concerns have been addressed Patient grounding bracelet on and cord attached  to chamber Specifics for Inpatients (complete in addition to above) Medication sheet sent with patient Intravenous medications needed or due during therapy sent with patient Drainage tubes (e.g. nasogastric tube or chest tube secured and vented) Endotracheal or Tracheotomy tube secured Cuff deflated of air and inflated with saline Airway suctioned Electronic Signature(s) Signed: 10/18/2017 11:53:01 AM By: Dayton Martes RCP, RRT, CHT [place], Shari Turner ([id number]) Entered By: Dayton Martes on 10/18/2017 32:67:12

## 2017-10-20 NOTE — Progress Notes (Signed)
JOSPHINE, LAFFEY ([id number]) Visit Report for 10/19/2017 Arrival Information Details Patient Name: Shari Turner, Shari Turner Date of Service: 10/19/2017 10:00 AM Medical Record Number: [id number] Patient Account Number: [id number] Date of Birth/Sex: [birth year] (76 y.o. Female) Treating RN: Primary Care Wynona Duhamel: Gershon Crane Other Clinician: Jacqulyn Bath Referring Linsey Hirota: Gershon Crane Treating Eleftheria Taborn/Extender: Melburn Hake, [name] Weeks in Treatment: 12 Visit Information History Since Last Visit Added or deleted any medications: No Patient Arrived: Wheel Chair Any new allergies or adverse reactions: No Arrival Time: 09:15 Had a fall or experienced change in No Accompanied By: granddaughter, [name] activities of daily living that may affect Transfer Assistance: Manual risk of falls: Patient Identification Verified: Yes Signs or symptoms of abuse/neglect since last visito No Secondary Verification Process Yes Hospitalized since last visit: No Completed: Pain Present Now: No Patient Has Alerts: Yes Patient Alerts: Patient on Blood Thinner DMII Eliquis Electronic Signature(s) Signed: 10/19/2017 11:58:47 AM By: Lorine Bears RCP, RRT, CHT Entered By: Lorine Bears on 10/19/2017 09:44:32 Shari Turner ([id number]) -------------------------------------------------------------------------------- Encounter Discharge Information Details Patient Name: Shari Turner Date of Service: 10/19/2017 10:00 AM Medical Record Number: [id number] Patient Account Number: [id number] Date of Birth/Sex: 22-Jun-1941 (76 y.o. Female) Treating RN: Primary Care Hriday Stai: Gershon Crane Other Clinician: Jacqulyn Bath Referring Arther Heisler: Gershon Crane Treating Jayron Maqueda/Extender: Melburn Hake, [name] Weeks in Treatment: 12 Encounter Discharge Information Items Discharge Pain Level: 0 Discharge Condition: Stable Ambulatory Status: Wheelchair Discharge Destination:  Home Transportation: Private Auto granddaughter, Accompanied By: Arby Barrette Schedule Follow-up Appointment: No Medication Reconciliation completed and No provided to Patient/Care Christoher Drudge: Clinical Summary of Care: Notes Patient has an HBO treatment scheduled on 10/22/17 at 10:00 am. Electronic Signature(s) Signed: 10/19/2017 11:58:47 AM By: Lorine Bears RCP, RRT, CHT Entered By: Lorine Bears on 10/19/2017 11:58:20 Shari Turner ([id number]) -------------------------------------------------------------------------------- Vitals Details Patient Name: Shari Turner Date of Service: 10/19/2017 10:00 AM Medical Record Number: [id number] Patient Account Number: [id number] Date of Birth/Sex: May 21, 1941 (76 y.o. Female) Treating RN: Primary Care Kasha Howeth: Gershon Crane Other Clinician: Jacqulyn Bath Referring Embry Huss: Gershon Crane Treating Alvester Eads/Extender: Melburn Hake, [name] Weeks in Treatment: 12 Vital Signs Time Taken: 09:17 Temperature (F): 97.8 Height (in): 62 Pulse (bpm): 72 Weight (lbs): 148 Respiratory Rate (breaths/min): 16 Body Mass Index (BMI): 27.1 Blood Pressure (mmHg): 122/64 Capillary Blood Glucose (mg/dl): 156 Reference Range: 80 - 120 mg / dl Electronic Signature(s) Signed: 10/19/2017 11:58:47 AM By: Lorine Bears RCP, RRT, CHT Entered By: Lorine Bears on 10/19/2017 09:45:01

## 2017-10-20 NOTE — Progress Notes (Signed)
BARRY, CULVERHOUSE ([id number]) Visit Report for 10/18/2017 Problem List Details Patient Name: Shari Turner, Shari Turner Date of Service: 10/18/2017 10:00 AM Medical Record Number: [id number] Patient Account Number: [id number] Date of Birth/Sex: [birth year] (76 y.o. 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

## 2017-10-21 NOTE — Progress Notes (Signed)
[name], [name] ([id number]) Visit Report for 10/19/2017 Problem List Details Patient Name: Shari Turner, Shari Turner Date of Service: 10/19/2017 10:00 AM Medical Record Number: [id number] Patient Account Number: [id number] Date of Birth/Sex: 1941/08/21 (76 y.o. 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

## 2017-10-22 ENCOUNTER — Encounter: Payer: Medicare Other | Admitting: Physician Assistant

## 2017-10-22 DIAGNOSIS — E11622 Type 2 diabetes mellitus with other skin ulcer: Secondary | ICD-10-CM | POA: Diagnosis not present

## 2017-10-22 LAB — GLUCOSE, CAPILLARY
GLUCOSE-CAPILLARY: 166 mg/dL — AB (ref 65–99)
Glucose-Capillary: 199 mg/dL — ABNORMAL HIGH (ref 65–99)

## 2017-10-22 NOTE — Progress Notes (Addendum)
Shari Turner, Shari Turner ([id number]) Visit Report for 10/22/2017 Arrival Information Details Patient Name: Shari Turner, Shari Turner Date of Service: 10/22/2017 10:00 AM Medical Record Number: [id number] Patient Account Number: [id number] Date of Birth/Sex: 10/14/1940 (76 y.o. Female) Treating RN: Cornell Barman Primary Care Floy Riegler: Gershon Crane Other Clinician: Referring Shawnese Magner: Gershon Crane Treating Zaid Tomes/Extender: Melburn Hake, [name] Weeks in Treatment: 12 Visit Information History Since Last Visit Added or deleted any medications: No Patient Arrived: Wheel Chair Any new allergies or adverse reactions: No Arrival Time: 09:44 Had a fall or experienced change in No Accompanied By: grandaughter activities of daily living that may affect Transfer Assistance: None risk of falls: Patient Identification Verified: Yes Signs or symptoms of abuse/neglect since last visito No Secondary Verification Process Yes Hospitalized since last visit: No Completed: Pain Present Now: No Patient Has Alerts: Yes Patient Alerts: Patient on Blood Thinner DMII Eliquis Electronic Signature(s) Signed: 10/22/2017 9:44:28 AM By: Gretta Cool, [name], RN, CWS, [name], [name] Entered By: Gretta Cool, [name], RN, CWS, Kim on 10/22/2017 09:44:28 Shari Turner ([id number]) -------------------------------------------------------------------------------- Encounter Discharge Information Details Patient Name: Shari Turner Date of Service: 10/22/2017 10:00 AM Medical Record Number: [id number] Patient Account Number: [id number] Date of Birth/Sex: 1941-07-08 (76 y.o. Female) Treating RN: Cornell Barman Primary Care Susy Placzek: Gershon Crane Other Clinician: Referring Kaybree Williams: Gershon Crane Treating Endora Teresi/Extender: Melburn Hake, [name] Weeks in Treatment: 12 Encounter Discharge Information Items Discharge Pain Level: 0 Discharge Condition: Stable Ambulatory Status: Wheelchair Discharge Destination:  Home Private Transportation: Auto Accompanied By: daughter Schedule Follow-up Appointment: Yes Medication Reconciliation completed and Yes provided to Patient/Care Jerett Odonohue: Clinical Summary of Care: Electronic Signature(s) Signed: 10/22/2017 12:03:09 PM By: Gretta Cool, [name], RN, CWS, [name], [name] Entered By: Gretta Cool, [name], RN, CWS, Kim on 10/22/2017 12:03:08 Shari Turner ([id number]) -------------------------------------------------------------------------------- Patient/Caregiver Education Details Patient Name: Shari Turner Date of Service: 10/22/2017 10:00 AM Medical Record Number: [id number] Patient Account Number: [id number] Date of Birth/Gender: 16-May-1941 (76 y.o. Female) Treating RN: Cornell Barman Primary Care Physician: Gershon Crane Other Clinician: Referring Physician: Gershon Crane Treating Physician/Extender: Sharalyn Ink in Treatment: 12 Education Assessment Education Provided To: Patient Education Topics Provided Hyperbaric Oxygenation: Handouts: Hyperbaric Oxygen, Other: continue treatments as prescribed Methods: Demonstration, Explain/Verbal Responses: State content correctly Electronic Signature(s) Signed: 10/23/2017 5:15:12 PM By: Gretta Cool, [name], RN, CWS, [name], [name] Entered By: Gretta Cool, [name], RN, CWS, Kim on 10/22/2017 12:02:51 Shari Turner ([id number]) -------------------------------------------------------------------------------- Vitals Details Patient Name: Shari Turner Date of Service: 10/22/2017 10:00 AM Medical Record Number: [id number] Patient Account Number: [id number] Date of Birth/Sex: 19-Sep-1941 (76 y.o. Female) Treating RN: Cornell Barman Primary Care Gianni Fuchs: Gershon Crane Other Clinician: Referring Chayim Bialas: Gershon Crane Treating Chyla Schlender/Extender: Melburn Hake, [name] Weeks in Treatment: 12 Vital Signs Time Taken: 09:31 Temperature (F): 98.1 Height (in): 62 Pulse (bpm): 72 Weight (lbs): 148 Respiratory Rate (breaths/min):  16 Body Mass Index (BMI): 27.1 Blood Pressure (mmHg): 122/60 Capillary Blood Glucose (mg/dl): 199 Reference Range: 80 - 120 mg / dl Electronic Signature(s) Signed: 10/22/2017 9:44:59 AM By: Gretta Cool, [name], RN, CWS, [name], [name] Entered By: Gretta Cool, [name], RN, CWS, Kim on 10/22/2017 09:44:58

## 2017-10-22 NOTE — Progress Notes (Addendum)
Shari Turner, Shari Turner ([id number]) Visit Report for 10/22/2017 HBO Details Patient Name: Shari Turner, Shari Turner Date of Service: 10/22/2017 10:00 AM Medical Record Number: [id number] Patient Account Number: [id number] Date of Birth/Sex: [birth year] (76 y.o. 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 HBO Treatment Details Treatment Number: 31 Patient Type: Outpatient Chamber Type: Monoplace Chamber Serial #: [id number] Treatment Protocol: 2.0 ATA with 90 minutes oxygen, and no air breaks Treatment Details Compression Rate Down: 1.5 psi / minute De-Compression Rate Up: 1.5 psi / minute Compress Tx Pressure Air breaks and breathing periods Decompress Decompress Begins Reached (leave unused spaces blank) Begins Ends Chamber Pressure (ATA) 1 2 - - - - - - 2 1 Clock Time (24 hr) 09:53 10:04 - - - - - - 11:34 11:45 Treatment Length: 112 (minutes) Treatment Segments: 4 Capillary Blood Glucose Pre Capillary Blood Glucose (mg/dl): Post Capillary Blood Glucose (mg/dl): Vital Signs Capillary Blood Glucose Reference Range: 80 - 120 mg / dl HBO Diabetic Blood Glucose Intervention Range: <131 mg/dl or >440 mg/dl Time Vitals Blood Respiratory Capillary Blood Glucose Pulse Action Type: Pulse: Temperature: Taken: Pressure: Rate: Glucose (mg/dl): Meter #: Oximetry (%) Taken: Pre 09:31 122/60 72 16 98.1 199 Post 10:49 120/78 72 16 97.8 166 Treatment Response Treatment Toleration: Well Treatment Completion Treatment Completed without Adverse Event Status: Shari Turner Notes Patient continues to tolerate HBO therapy without any sign of complication.  She has had no issues up to this point. HBO Attestation I certify that I supervised this HBO treatment in accordance with Medicare guidelines. A trained emergency response Yes team is readily available per hospital policies and procedures. Shari Turner, Shari Turner ([id number]) Continue HBOT as ordered. Yes Electronic Signature(s) Signed: 10/23/2017 10:30:07 AM By: [name] PA-C Previous Signature: 10/22/2017 11:52:04 AM Version By: [name] [name], RN, CWS, [name], [name] Previous Signature: 10/22/2017 11:49:21 AM Version By: [name] [name], RN, CWS, [name], [name] Previous Signature: 10/22/2017 11:40:57 AM Version By: [name] [name], RN, CWS, [name], [name] Previous Signature: 10/22/2017 10:04:17 AM Version By: [name] [name], RN, CWS, [name], [name] Previous Signature: 10/22/2017 9:55:03 AM Version By: [name], [name], RN, CWS, [name], [name] Entered By: [name] on 10/22/2017 16:51:06 Shari Turner, Shari Turner ([id number]) -------------------------------------------------------------------------------- HBO Safety Checklist Details Patient Name: Shari Turner Date of Service: 10/22/2017 10:00 AM Medical Record Number: [id number] Patient Account Number: [id number] Date of Birth/Sex: 07-Mar-1941 (76 y.o. Female) Treating RN: [name] Primary Care Fraidy Mccarrick: [name] Other Clinician: Referring Axten Pascucci: [name] Treating Mirage Pfefferkorn/Extender: [name], [name] Weeks in Treatment: 12 HBO Safety Checklist Items Safety Checklist Consent Form Signed Patient voided / foley secured and emptied When did you last eato 10/22/2017 AM Last dose of injectable or oral agent 10/22/2017 AM Ostomy pouch emptied and vented if applicable NA All implantable devices assessed, documented and approved NA Intravenous access site secured and place NA Valuables secured Linens and cotton and cotton/polyester blend (less than 51% polyester) Personal oil-based products / skin lotions / body lotions removed Wigs or hairpieces removed Smoking or  tobacco materials removed NA Books / newspapers / magazines / loose paper removed NA Cologne, aftershave, perfume and deodorant removed NA Jewelry removed (may wrap wedding band) NA Make-up removed Hair care products removed Battery operated devices (external) removed Heating patches and  chemical warmers removed NA Titanium eyewear removed NA Nail polish cured greater than 10 hours Casting material cured greater than 10 hours NA Hearing aids removed NA Loose dentures or partials removed Prosthetics have been removed NA Patient demonstrates correct use of air break device (if applicable) Patient concerns have been addressed Patient grounding bracelet on and cord attached to chamber Specifics for Inpatients (complete in addition to above) Medication sheet sent with patient Intravenous medications needed or due during therapy sent with patient Drainage tubes (e.g. nasogastric tube or chest tube secured and vented) Endotracheal or Tracheotomy tube secured Cuff deflated of air and inflated with saline Airway suctioned Electronic Signature(s) Signed: 10/22/2017 10:02:43 AM By: [name], [name], RN, CWS, [name], [name] Previous Signature: 10/22/2017 10:02:04 AM Version By: [name], [name], RN, CWS, [name], [name] Shari Turner, Shari Turner ([id number]) Previous Signature: 10/22/2017 9:45:58 AM Version By: [name], [name], RN, CWS, [name], [name] Entered By: [name], [name], RN, CWS, Kim on 10/22/2017 10:02:42

## 2017-10-23 ENCOUNTER — Encounter: Payer: Medicare Other | Admitting: Physician Assistant

## 2017-10-23 DIAGNOSIS — E11622 Type 2 diabetes mellitus with other skin ulcer: Secondary | ICD-10-CM | POA: Diagnosis not present

## 2017-10-23 LAB — GLUCOSE, CAPILLARY
GLUCOSE-CAPILLARY: 175 mg/dL — AB (ref 65–99)
Glucose-Capillary: 193 mg/dL — ABNORMAL HIGH (ref 65–99)

## 2017-10-23 NOTE — Progress Notes (Signed)
VALLORIE, NICCOLI ([id number]) Visit Report for 10/22/2017 Problem List Details Patient Name: Shari Turner, Shari Turner Date of Service: 10/22/2017 10:00 AM Medical Record Number: [id number] Patient Account Number: [id number] Date of Birth/Sex: [birth year] (76 y.o. 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 M86.68 Other chronic osteomyelitis, other site L59.9 Disorder of the skin and subcutaneous  tissue related to radiation, unspecified Facility Procedures CPT4 Code: 38882800 Description: (Facility Use Only) HBOT, full body chamber, 70min Modifier: Quantity: 4 Physician Procedures CPT4 Code Description: 3491791 50569 - WC PHYS HYPERBARIC OXYGEN THERAPY ICD-10 Diagnosis Description M86.68 Other chronic osteomyelitis, other site 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

## 2017-10-24 ENCOUNTER — Encounter: Payer: Medicare Other | Admitting: Internal Medicine

## 2017-10-24 DIAGNOSIS — E11622 Type 2 diabetes mellitus with other skin ulcer: Secondary | ICD-10-CM | POA: Diagnosis not present

## 2017-10-24 LAB — GLUCOSE, CAPILLARY
GLUCOSE-CAPILLARY: 181 mg/dL — AB (ref 65–99)
Glucose-Capillary: 175 mg/dL — ABNORMAL HIGH (ref 65–99)

## 2017-10-24 NOTE — Progress Notes (Signed)
Shari Turner, Shari Turner ([id number]) Visit Report for 10/23/2017 Arrival Information Details Patient Name: Shari Turner, Shari Turner Date of Service: 10/23/2017 10:00 AM Medical Record Number: [id number] Patient Account Number: [id number] Date of Birth/Sex: Apr 21, 1941 (76 y.o. Female) Treating RN: Primary Care Jaymeson Mengel: Gershon Crane Other Clinician: Jacqulyn Bath Referring Walterine Amodei: Gershon Crane Treating Tremain Rucinski/Extender: Melburn Hake, [name] Weeks in Treatment: 12 Visit Information History Since Last Visit Added or deleted any medications: No Patient Arrived: Wheel Chair Any new allergies or adverse reactions: No Arrival Time: 09:35 Had a fall or experienced change in No Accompanied By: granddaughter, [name] activities of daily living that may affect Transfer Assistance: Manual risk of falls: Patient Identification Verified: Yes Signs or symptoms of abuse/neglect since last visito No Secondary Verification Process Yes Hospitalized since last visit: No Completed: Pain Present Now: No Patient Has Alerts: Yes Patient Alerts: Patient on Blood Thinner DMII Eliquis Electronic Signature(s) Signed: 10/23/2017 4:28:07 PM By: Lorine Bears RCP, RRT, CHT Entered By: Lorine Bears on 10/23/2017 09:44:07 Shari Turner ([id number]) -------------------------------------------------------------------------------- Encounter Discharge Information Details Patient Name: Shari Turner Date of Service: 10/23/2017 10:00 AM Medical Record Number: [id number] Patient Account Number: [id number] Date of Birth/Sex: November 11, 1940 (76 y.o. Female) Treating RN: Primary Care Rowynn Mcweeney: Gershon Crane Other Clinician: Jacqulyn Bath Referring Kaleth Koy: Gershon Crane Treating Delfin Squillace/Extender: Melburn Hake, [name] Weeks in Treatment: 12 Encounter Discharge Information Items Discharge Pain Level: 0 Discharge Condition: Stable Ambulatory Status: Wheelchair Discharge Destination:  Home Transportation: Private Auto daughter, Accompanied By: Georgina Peer Schedule Follow-up Appointment: No Medication Reconciliation completed and No provided to Patient/Care Cleaven Demario: Clinical Summary of Care: Notes Patient has an HBO treatment scheduled on 10/24/17 at 10:00 am. Electronic Signature(s) Signed: 10/23/2017 4:28:07 PM By: Lorine Bears RCP, RRT, CHT Entered By: Lorine Bears on 10/23/2017 12:05:51 Shari Turner ([id number]) -------------------------------------------------------------------------------- Vitals Details Patient Name: Shari Turner Date of Service: 10/23/2017 10:00 AM Medical Record Number: [id number] Patient Account Number: [id number] Date of Birth/Sex: [birth year] (76 y.o. Female) Treating RN: Primary Care Ivie Savitt: Gershon Crane Other Clinician: Jacqulyn Bath Referring Jeanni Allshouse: Gershon Crane Treating Isabel Ardila/Extender: Melburn Hake, [name] Weeks in Treatment: 12 Vital Signs Time Taken: 09:38 Temperature (F): 98.0 Height (in): 62 Pulse (bpm): 72 Weight (lbs): 148 Respiratory Rate (breaths/min): 16 Body Mass Index (BMI): 27.1 Blood Pressure (mmHg): 120/62 Capillary Blood Glucose (mg/dl): 175 Reference Range: 80 - 120 mg / dl Electronic Signature(s) Signed: 10/23/2017 4:28:07 PM By: Lorine Bears RCP, RRT, CHT Entered By: Lorine Bears on 10/23/2017 09:44:36

## 2017-10-24 NOTE — Progress Notes (Signed)
MASHELL, SIEBEN ([id number]) Visit Report for 10/23/2017 Problem List Details Patient Name: Shari Turner, Shari Turner Date of Service: 10/23/2017 10:00 AM Medical Record Number: [id number] Patient Account Number: [id number] Date of Birth/Sex: 22-May-1941 (76 y.o. 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

## 2017-10-24 NOTE — Progress Notes (Signed)
Shari Turner, Shari Turner ([id number]) Visit Report for 10/24/2017 Arrival Information Details Patient Name: Shari Turner, Shari Turner Date of Service: 10/24/2017 10:00 AM Medical Record Number: [id number] Patient Account Number: [id number] Date of Birth/Sex: May 14, 1941 (76 y.o. Female) Treating RN: Primary Care Alaynna Kerwood: Gershon Crane Other Clinician: Jacqulyn Bath Referring Ridhi Hoffert: Gershon Crane Treating Lus Kriegel/Extender: Tito Dine in Treatment: 12 Visit Information History Since Last Visit Added or deleted any medications: No Patient Arrived: Wheel Chair Any new allergies or adverse reactions: No Arrival Time: 09:30 Had a fall or experienced change in No Accompanied By: granddaughter, [name] activities of daily living that may affect Transfer Assistance: Manual risk of falls: Patient Identification Verified: Yes Signs or symptoms of abuse/neglect since last visito No Secondary Verification Process Yes Hospitalized since last visit: No Completed: Pain Present Now: No Patient Has Alerts: Yes Patient Alerts: Patient on Blood Thinner DMII Eliquis Electronic Signature(s) Signed: 10/24/2017 1:09:20 PM By: Lorine Bears RCP, RRT, CHT Entered By: Lorine Bears on 10/24/2017 09:58:35 Shari Turner ([id number]) -------------------------------------------------------------------------------- Encounter Discharge Information Details Patient Name: Shari Turner Date of Service: 10/24/2017 10:00 AM Medical Record Number: [id number] Patient Account Number: [id number] Date of Birth/Sex: 1940-12-19 (76 y.o. Female) Treating RN: Primary Care Jerusha Reising: Gershon Crane Other Clinician: Jacqulyn Bath Referring Lasaro Primm: Gershon Crane Treating Yoel Kaufhold/Extender: Tito Dine in Treatment: 12 Encounter Discharge Information Items Discharge Pain Level: 0 Discharge Condition: Stable Ambulatory Status: Wheelchair Discharge Destination:  Home Transportation: Private Auto daughter, Accompanied By: Georgina Peer Schedule Follow-up Appointment: No Medication Reconciliation completed and No provided to Patient/Care Zaira Iacovelli: Clinical Summary of Care: Notes Patient has an HBO treatment scheduled on 10/25/17 at 10:00 am. Electronic Signature(s) Signed: 10/24/2017 1:09:20 PM By: Lorine Bears RCP, RRT, CHT Entered By: Lorine Bears on 10/24/2017 13:09:03 Shari Turner ([id number]) -------------------------------------------------------------------------------- Vitals Details Patient Name: Shari Turner Date of Service: 10/24/2017 10:00 AM Medical Record Number: [id number] Patient Account Number: [id number] Date of Birth/Sex: 1941/02/21 (76 y.o. Female) Treating RN: Primary Care Marvin Maenza: Gershon Crane Other Clinician: Jacqulyn Bath Referring Chevez Sambrano: Gershon Crane Treating Rhylynn Perdomo/Extender: Tito Dine in Treatment: 12 Vital Signs Time Taken: 09:30 Temperature (F): 98.3 Height (in): 62 Pulse (bpm): 72 Weight (lbs): 148 Respiratory Rate (breaths/min): 16 Body Mass Index (BMI): 27.1 Blood Pressure (mmHg): 122/66 Capillary Blood Glucose (mg/dl): 181 Reference Range: 80 - 120 mg / dl Electronic Signature(s) Signed: 10/24/2017 1:09:20 PM By: Lorine Bears RCP, RRT, CHT Entered By: Lorine Bears on 10/24/2017 09:59:17

## 2017-10-24 NOTE — Progress Notes (Signed)
Shari Turner, Shari Turner ([id number]) Visit Report for 10/23/2017 HBO Details Patient Name: Shari Turner, Shari Turner Date of Service: 10/23/2017 10:00 AM Medical Record Number: [id number] Patient Account Number: [id number] Date of Birth/Sex: 06-04-1941 (77 y.o. 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 HBO Turner Details Turner Number: [id number] Patient Type: Outpatient Chamber Type: Monoplace Chamber Serial #: [id number] Turner Protocol: 2.0 ATA with 90 minutes oxygen, and no air breaks Turner Details Compression Rate Down: 1.5 psi / minute De-Compression Rate Up: 1.5 psi / minute Compress Tx Pressure Air breaks and breathing periods Decompress Decompress Begins Reached (leave unused spaces blank) Begins Ends Chamber Pressure (ATA) 1 2 - - - - - - 2 1 Clock Time (24 hr) 09:58 10:09 - - - - - - 11:39 11:49 Turner Length: 111 (minutes) Turner Segments: 4 Capillary Blood Glucose Pre Capillary Blood Glucose (mg/dl): Post Capillary Blood Glucose (mg/dl): Vital Signs Capillary Blood Glucose Reference Range: 80 - 120 mg / dl HBO Diabetic Blood Glucose Intervention Range: <131 mg/dl or >846 mg/dl Time Vitals Blood Respiratory Capillary Blood Glucose Pulse Action Type: Pulse: Temperature: Taken: Pressure: Rate: Glucose (mg/dl): Meter #: Oximetry (%) Taken: Pre 09:38 120/62 72 16 98 175 1 none per protocol Post 11:55 122/68 72 16 98 193 1 none per protocol Turner Response Turner Toleration: Well Turner Completion Turner Completed without Adverse Event Status: Shari Turner Notes Patient's HBO course went well today  without any complication. HBO Attestation I certify that I supervised this HBO Turner in accordance with Medicare guidelines. A trained emergency response Yes team is readily available per hospital policies and procedures. Shari Turner, Shari Turner ([id number]) Continue HBOT as ordered. Yes Electronic Signature(s) Signed: 10/23/2017 4:32:07 PM By: Shari Kelp PA-C Entered By: Shari Turner on 10/23/2017 15:45:09 Shari Turner ([id number]) -------------------------------------------------------------------------------- HBO Safety Checklist Details Patient Name: Shari Turner Date of Service: 10/23/2017 10:00 AM Medical Record Number: [id number] Patient Account Number: [id number] Date of Birth/Sex: [birth year] (77 y.o. Female) Treating RN: Primary Care Shari Turner: Shari Turner Other Clinician: Izetta Turner Referring Shari Turner: Shari Turner Treating Shari Turner/Extender: Shari Turner, Shari Turner: 12 HBO Safety Checklist Items Safety Checklist Consent Form Signed Patient voided / foley secured and emptied When did you last eato 10/23/17 am Last dose of injectable or oral agent 10/23/17 am Ostomy pouch emptied and vented if applicable NA All implantable devices assessed, documented and approved NA Intravenous access site secured and place NA Valuables secured Linens and cotton and cotton/polyester blend (less than 51% polyester) Personal oil-based products / skin lotions / body lotions removed Wigs or hairpieces removed NA Smoking or tobacco materials removed NA Books / newspapers / magazines / loose paper removed Cologne, aftershave, perfume and deodorant removed Jewelry removed (may wrap wedding band) Make-up removed Hair care products removed Battery operated devices (external) removed Heating patches and chemical warmers removed Titanium eyewear removed NA Nail polish cured greater than 10 hours NA Casting material cured greater than 10 hours NA Hearing  aids removed NA Loose dentures or partials removed NA Prosthetics have been removed NA Patient demonstrates correct use of air break device (if applicable) Patient concerns have been addressed Patient grounding bracelet on and cord attached to chamber Specifics for Inpatients (complete in addition to above) Medication sheet sent with patient  Intravenous medications needed or due during therapy sent with patient Drainage tubes (e.g. nasogastric tube or chest tube secured and vented) Endotracheal or Tracheotomy tube secured Cuff deflated of air and inflated with saline Airway suctioned Electronic Signature(s) Signed: 10/23/2017 4:28:07 PM By: Shari Turner RCP, RRT, CHT Shari Turner ([id number]) Entered By: Shari Turner on 10/23/2017 09:45:45

## 2017-10-25 ENCOUNTER — Encounter: Payer: Medicare Other | Admitting: Nurse Practitioner

## 2017-10-25 DIAGNOSIS — E11622 Type 2 diabetes mellitus with other skin ulcer: Secondary | ICD-10-CM | POA: Diagnosis not present

## 2017-10-25 LAB — GLUCOSE, CAPILLARY
GLUCOSE-CAPILLARY: 195 mg/dL — AB (ref 65–99)
Glucose-Capillary: 182 mg/dL — ABNORMAL HIGH (ref 65–99)

## 2017-10-25 NOTE — Progress Notes (Signed)
Shari Turner, Shari Turner ([id number]) Visit Report for 10/24/2017 HBO Details Patient Name: Shari Turner, Shari Turner Date of Service: 10/24/2017 10:00 AM Medical Record Number: [id number] Patient Account Number: [id number] Date of Birth/Sex: [birth year] (76 y.o. 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 HBO Treatment Details Treatment Number: 33 Patient Type: Outpatient Chamber Type: Monoplace Chamber Serial #: [id number] Treatment Protocol: 2.0 ATA with 90 minutes oxygen, and no air breaks Treatment Details Compression Rate Down: 1.5 psi / minute De-Compression Rate Up: 1.5 psi / minute Compress Tx Pressure Air breaks and breathing periods Decompress Decompress Begins Reached (leave unused spaces blank) Begins Ends Chamber Pressure (ATA) 1 2 - - - - - - 2 1 Clock Time (24 hr) 09:56 10:06 - - - - - - 11:36 11:46 Treatment Length: 110 (minutes) Treatment Segments: 4 Capillary Blood Glucose Pre Capillary Blood Glucose (mg/dl): Post Capillary Blood Glucose (mg/dl): Vital Signs Capillary Blood Glucose Reference Range: 80 - 120 mg / dl HBO Diabetic Blood Glucose Intervention Range: <131 mg/dl or >063 mg/dl Time Vitals Blood Respiratory Capillary Blood Glucose Pulse Action Type: Pulse: Temperature: Taken: Pressure: Rate: Glucose (mg/dl): Meter #: Oximetry (%) Taken: Pre 09:30 122/66 72 16 98.3 181 1 none per protocol Post 11:51 118/78 72 16 97.9 175 1 none per protocol Treatment Response Treatment Completion Status: Treatment Completed without Adverse Event Shari Turner Notes no concerns with treatment given HBO Attestation I certify  that I supervised this HBO treatment in accordance with Medicare guidelines. A trained emergency response Yes team is readily available per hospital policies and procedures. Continue HBOT as ordered. Shari Turner, Shari Turner ([id number]) Electronic Signature(s) Signed: 10/24/2017 4:56:29 PM By: Baltazar Najjar MD Previous Signature: 10/24/2017 1:09:20 PM Version By: Dayton Martes RCP, RRT, CHT Entered By: Baltazar Najjar on 10/24/2017 16:55:41 Shari Turner ([id number]) -------------------------------------------------------------------------------- HBO Safety Checklist Details Patient Name: Shari Turner Date of Service: 10/24/2017 10:00 AM Medical Record Number: [id number] Patient Account Number: [id number] Date of Birth/Sex: [birth year] (76 y.o. Female) Treating RN: Primary Care Raegen Tarpley: [name] Other Clinician: [name] Referring Kamoni Depree: [name] Treating Shari Turner: Altamese [place] in Treatment: 12 HBO Safety Checklist Items Safety Checklist Consent Form Signed Patient voided / foley secured and emptied When did you last eato 10/24/17 am Last dose of injectable or oral agent 10/24/17 am Ostomy pouch emptied and vented if applicable NA All implantable devices assessed, documented and approved NA Intravenous access site secured and place NA Valuables secured Linens and cotton and cotton/polyester blend (less than 51% polyester) Personal oil-based products / skin lotions / body lotions removed Wigs or hairpieces removed NA Smoking or tobacco materials removed NA Books / newspapers / magazines / loose paper removed Cologne, aftershave, perfume and deodorant removed Jewelry removed (may wrap wedding band) Make-up removed Hair care products removed Battery operated devices (external) removed Heating patches and chemical warmers removed Titanium eyewear removed NA Nail polish cured greater than 10 hours NA Casting material  cured greater than 10 hours NA Hearing aids removed NA Loose dentures or partials removed NA Prosthetics have been removed NA Patient demonstrates correct use of air break device (if applicable) Patient concerns have been addressed Patient grounding bracelet on and cord attached to chamber Specifics for Inpatients (complete in addition to above) Medication  sheet sent with patient Intravenous medications needed or due during therapy sent with patient Drainage tubes (e.g. nasogastric tube or chest tube secured and vented) Endotracheal or Tracheotomy tube secured Cuff deflated of air and inflated with saline Airway suctioned Electronic Signature(s) Signed: 10/24/2017 1:09:20 PM By: Dayton Martes RCP, RRT, CHT [place], [name] ([id number]) Entered By: Dayton Martes on 10/24/2017 10:00:12

## 2017-10-26 ENCOUNTER — Encounter: Payer: Medicare Other | Admitting: Physician Assistant

## 2017-10-26 DIAGNOSIS — E11622 Type 2 diabetes mellitus with other skin ulcer: Secondary | ICD-10-CM | POA: Diagnosis not present

## 2017-10-26 LAB — GLUCOSE, CAPILLARY
Glucose-Capillary: 207 mg/dL — ABNORMAL HIGH (ref 65–99)
Glucose-Capillary: 166 mg/dL — ABNORMAL HIGH (ref 65–99)

## 2017-10-26 NOTE — Progress Notes (Signed)
[name], Turner ([id number]) Visit Report for 10/25/2017 Arrival Information Details Patient Name: Shari Turner, Shari Turner Date of Service: 10/25/2017 10:00 AM Medical Record Number: [id number] Patient Account Number: [id number] Date of Birth/Sex: 1941/07/03 (77 y.o. Female) Treating RN: Primary Care Daviona Herbert: Gershon Crane Other Clinician: Jacqulyn Bath Referring Cristofer Yaffe: Gershon Crane Treating Tamaria Dunleavy/Extender: Cathie Olden in Treatment: 12 Visit Information History Since Last Visit Added or deleted any medications: No Patient Arrived: Crutches Any new allergies or adverse reactions: No Arrival Time: 10:13 Had a fall or experienced change in No Accompanied By: granddaughter, [name] activities of daily living that may affect Transfer Assistance: Manual risk of falls: Patient Has Alerts: Yes Signs or symptoms of abuse/neglect since last visito No Patient Alerts: Patient on Blood Thinner Hospitalized since last visit: No DMII Pain Present Now: No Eliquis Electronic Signature(s) Signed: 10/25/2017 12:30:03 PM By: Lorine Bears RCP, RRT, CHT Entered By: Lorine Bears on 10/25/2017 11:14:46 Shari Turner ([id number]) -------------------------------------------------------------------------------- Encounter Discharge Information Details Patient Name: Shari Turner Date of Service: 10/25/2017 10:00 AM Medical Record Number: [id number] Patient Account Number: [id number] Date of Birth/Sex: November 07, 1940 (77 y.o. Female) Treating RN: Primary Care Cozetta Seif: Gershon Crane Other Clinician: Jacqulyn Bath Referring Emilynn Srinivasan: Gershon Crane Treating Orvil Faraone/Extender: Cathie Olden in Treatment: 12 Encounter Discharge Information Items Discharge Pain Level: 0 Discharge Condition: Stable Ambulatory Status: Wheelchair Discharge Destination: Home Transportation: Private Auto daughter, Accompanied By: Georgina Peer Schedule Follow-up Appointment:  No Medication Reconciliation completed and No provided to Patient/Care Arthella Headings: Clinical Summary of Care: Notes Patient has an HBO treatment scheduled on 10/26/17 at 10:00 am. Electronic Signature(s) Signed: 10/25/2017 12:30:03 PM By: Lorine Bears RCP, RRT, CHT Entered By: Lorine Bears on 10/25/2017 12:29:47 Shari Turner ([id number]) -------------------------------------------------------------------------------- Vitals Details Patient Name: Shari Turner Date of Service: 10/25/2017 10:00 AM Medical Record Number: [id number] Patient Account Number: [id number] Date of Birth/Sex: 1941-06-20 (77 y.o. Female) Treating RN: Primary Care Lyrika Souders: Gershon Crane Other Clinician: Jacqulyn Bath Referring Brenon Antosh: Gershon Crane Treating [place] Grayer/Extender: Cathie Olden in Treatment: 12 Vital Signs Time Taken: 09:20 Temperature (F): 97.8 Height (in): 62 Pulse (bpm): 72 Weight (lbs): 148 Respiratory Rate (breaths/min): 16 Body Mass Index (BMI): 27.1 Blood Pressure (mmHg): 114/68 Capillary Blood Glucose (mg/dl): 195 Reference Range: 80 - 120 mg / dl Electronic Signature(s) Signed: 10/25/2017 12:30:03 PM By: Lorine Bears RCP, RRT, CHT Entered By: Lorine Bears on 10/25/2017 11:15:26

## 2017-10-27 NOTE — Progress Notes (Signed)
LASHAUNDRA, Turner ([id number]) Visit Report for 10/25/2017 HBO Details Patient Name: Shari Turner, Shari Turner Date of Service: 10/25/2017 10:00 AM Medical Record Number: [id number] Patient Account Number: [id number] Date of Birth/Sex: [birth year] (76 y.o. 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 HBO Treatment Details Treatment Number: 34 Patient Type: Outpatient Chamber Type: Monoplace Chamber Serial #: [id number] Treatment Protocol: 2.0 ATA with 90 minutes oxygen, and no air breaks Treatment Details Compression Rate Down: 1.5 psi / minute De-Compression Rate Up: 1.5 psi / minute Compress Tx Pressure Air breaks and breathing periods Decompress Decompress Begins Reached (leave unused spaces blank) Begins Ends Chamber Pressure (ATA) 1 2 - - - - - - 2 1 Clock Time (24 hr) 09:43 09:53 - - - - - - 11:24 11:34 Treatment Length: 111 (minutes) Treatment Segments: 4 Capillary Blood Glucose Pre Capillary Blood Glucose (mg/dl): Post Capillary Blood Glucose (mg/dl): Vital Signs Capillary Blood Glucose Reference Range: 80 - 120 mg / dl HBO Diabetic Blood Glucose Intervention Range: <131 mg/dl or >027 mg/dl Time Vitals Blood Respiratory Capillary Blood Glucose Pulse Action Type: Pulse: Temperature: Taken: Pressure: Rate: Glucose (mg/dl): Meter #: Oximetry (%) Taken: Pre 09:20 114/68 72 16 97.8 195 1 none per protocol Post 11:40 122/78 78 16 97.6 182 1 none per protocol Treatment Response Treatment Completion Status: Treatment Completed without Adverse Event Electronic Signature(s) Signed: 10/25/2017 12:30:03 PM By: Dayton Martes RCP, RRT, CHT Signed: 10/26/2017 8:35:48 AM By: Bonnell Public Entered By: Dayton Martes on 10/25/2017 11:58:34 Shari Turner ([id number]) -------------------------------------------------------------------------------- HBO Safety Checklist Details Patient Name: Shari Turner Date of Service: 10/25/2017 10:00 AM Medical Record Number: [id number] Patient Account Number: [id number] Date of Birth/Sex: April 12, 1941 (76 y.o. Female) Treating RN: Primary Care Klye Besecker: [name] Other Clinician: [name] Referring Elica Almas: [name] Treating Opie Fanton/Extender: Kathreen Cosier in Treatment: 12 HBO Safety Checklist Items Safety Checklist Consent Form Signed Patient voided / foley secured and emptied When did you last eato 10/25/17 am Last dose of injectable or oral agent 10/25/17 am Ostomy pouch emptied and vented if applicable NA All implantable devices assessed, documented and approved NA Intravenous access site secured and place NA Valuables secured Linens and cotton and cotton/polyester blend (less than 51% polyester) Personal oil-based products / skin lotions / body lotions removed Wigs or hairpieces removed NA Smoking or tobacco materials removed NA Books / newspapers / magazines / loose paper removed Cologne, aftershave, perfume and deodorant removed Jewelry removed (may wrap wedding band) Make-up removed Hair care products removed Battery operated devices (external) removed Heating patches and chemical warmers removed Titanium eyewear removed NA Nail polish cured greater than 10 hours NA Casting material cured greater than 10 hours NA Hearing aids removed NA Loose dentures or partials removed NA Prosthetics have been removed NA Patient demonstrates correct use of air break device (if applicable) Patient concerns have been addressed Patient grounding bracelet on and cord attached to chamber Specifics for Inpatients  (complete in addition to above) Medication sheet sent with patient Intravenous medications needed or due during therapy sent with patient Drainage tubes (e.g. nasogastric tube or chest tube secured and vented) Endotracheal or Tracheotomy tube secured Cuff deflated of air and inflated with saline Airway suctioned Electronic Signature(s) Signed: 10/25/2017 12:30:03 PM By: Earlene Plater,  RCP,RRT,CHT, Sallie RCP, RRT, CHT MALIYA, TEPE ([id number]) Entered By: Dayton Martes on 10/25/2017 11:16:08

## 2017-10-27 NOTE — Progress Notes (Signed)
ANNALISA, COLONNA ([id number]) Visit Report for 10/26/2017 Arrival Information Details Patient Name: Shari Turner, Shari Turner Date of Service: 10/26/2017 10:00 AM Medical Record Number: [id number] Patient Account Number: [id number] Date of Birth/Sex: June 28, 1941 (77 y.o. Female) Treating RN: Primary Care Hazleigh Mccleave: Gershon Crane Other Clinician: Jacqulyn Bath Referring Hadassa Cermak: Gershon Crane Treating Derricka Mertz/Extender: Melburn Hake, [name] Weeks in Treatment: 40 Visit Information History Since Last Visit Added or deleted any medications: No Patient Arrived: Wheel Chair Any new allergies or adverse reactions: No Arrival Time: 09:30 Had a fall or experienced change in No Accompanied By: granddaughter, [name] activities of daily living that may affect Transfer Assistance: Manual risk of falls: Patient Identification Verified: Yes Signs or symptoms of abuse/neglect since last visito No Secondary Verification Process Yes Hospitalized since last visit: No Completed: Pain Present Now: No Patient Has Alerts: Yes Patient Alerts: Patient on Blood Thinner DMII Eliquis Electronic Signature(s) Signed: 10/26/2017 12:16:05 PM By: Lorine Bears RCP, RRT, CHT Entered By: Lorine Bears on 10/26/2017 09:56:20 Shari Turner ([id number]) -------------------------------------------------------------------------------- Encounter Discharge Information Details Patient Name: Shari Turner Date of Service: 10/26/2017 10:00 AM Medical Record Number: [id number] Patient Account Number: [id number] Date of Birth/Sex: 04/21/1941 (77 y.o. Female) Treating RN: Primary Care Dessirae Scarola: Gershon Crane Other Clinician: Jacqulyn Bath Referring Jamiyah Dingley: Gershon Crane Treating Destry Bezdek/Extender: Melburn Hake, [name] Weeks in Treatment: 65 Encounter Discharge Information Items Discharge Pain Level: 0 Discharge Condition: Stable Ambulatory Status: Wheelchair Discharge Destination:  Home Transportation: Private Auto granddaughter, Accompanied By: Arby Barrette Schedule Follow-up Appointment: No Medication Reconciliation completed and No provided to Patient/Care Nymir Ringler: Clinical Summary of Care: Notes Patient has an HBO treatment scheduled on 10/29/17 at 10:00 am. Electronic Signature(s) Signed: 10/26/2017 12:16:05 PM By: Lorine Bears RCP, RRT, CHT Entered By: Lorine Bears on 10/26/2017 12:15:19 Shari Turner ([id number]) -------------------------------------------------------------------------------- Vitals Details Patient Name: Shari Turner Date of Service: 10/26/2017 10:00 AM Medical Record Number: [id number] Patient Account Number: [id number] Date of Birth/Sex: 1941/07/20 (77 y.o. Female) Treating RN: Primary Care Loryn Haacke: Gershon Crane Other Clinician: Jacqulyn Bath Referring Ronica Vivian: Gershon Crane Treating Mkayla Steele/Extender: Melburn Hake, [name] Weeks in Treatment: 13 Vital Signs Time Taken: 09:31 Temperature (F): 98.0 Height (in): 62 Pulse (bpm): 78 Weight (lbs): 148 Respiratory Rate (breaths/min): 16 Body Mass Index (BMI): 27.1 Blood Pressure (mmHg): 116/74 Capillary Blood Glucose (mg/dl): 207 Reference Range: 80 - 120 mg / dl Electronic Signature(s) Signed: 10/26/2017 12:16:05 PM By: Lorine Bears RCP, RRT, CHT Entered By: Lorine Bears on 10/26/2017 09:57:42

## 2017-10-29 ENCOUNTER — Encounter: Payer: Medicare Other | Admitting: Physician Assistant

## 2017-10-29 DIAGNOSIS — E11622 Type 2 diabetes mellitus with other skin ulcer: Secondary | ICD-10-CM | POA: Diagnosis not present

## 2017-10-29 LAB — GLUCOSE, CAPILLARY
GLUCOSE-CAPILLARY: 226 mg/dL — AB (ref 65–99)
GLUCOSE-CAPILLARY: 181 mg/dL — AB (ref 65–99)

## 2017-10-29 NOTE — Progress Notes (Signed)
SHAYLYNNE, LUNT ([id number]) Visit Report for 10/26/2017 Problem List Details Patient Name: KINESHA, AUTEN Date of Service: 10/26/2017 10:00 AM Medical Record Number: [id number] Patient Account Number: [id number] Date of Birth/Sex: January 23, 1941 (76 y.o. 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

## 2017-10-29 NOTE — Progress Notes (Signed)
Shari, Turner ([id number]) Visit Report for 10/26/2017 HBO Details Patient Name: Shari Turner, Shari Turner Date of Service: 10/26/2017 10:00 AM Medical Record Number: [id number] Patient Account Number: [id number] Date of Birth/Sex: [birth year] (77 y.o. 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 HBO Treatment Details Treatment Number: 35 Patient Type: Outpatient Chamber Type: Monoplace Chamber Serial #: [id number] Treatment Protocol: 2.0 ATA with 90 minutes oxygen, and no air breaks Treatment Details Compression Rate Down: 1.5 psi / minute De-Compression Rate Up: 1.5 psi / minute Compress Tx Pressure Air breaks and breathing periods Decompress Decompress Begins Reached (leave unused spaces blank) Begins Ends Chamber Pressure (ATA) 1 2 - - - - - - 2 1 Clock Time (24 hr) 09:52 10:02 - - - - - - 11:32 11:42 Treatment Length: 110 (minutes) Treatment Segments: 4 Capillary Blood Glucose Pre Capillary Blood Glucose (mg/dl): Post Capillary Blood Glucose (mg/dl): Vital Signs Capillary Blood Glucose Reference Range: 80 - 120 mg / dl HBO Diabetic Blood Glucose Intervention Range: <131 mg/dl or >347 mg/dl Time Vitals Blood Respiratory Capillary Blood Glucose Pulse Action Type: Pulse: Temperature: Taken: Pressure: Rate: Glucose (mg/dl): Meter #: Oximetry (%) Taken: Pre 09:31 116/74 78 16 98 207 1 none per protocol Post 11:47 118/78 78 16 97.8 166 1 none per protocol Treatment Response Treatment Toleration: Well Treatment Completion Treatment Completed without Adverse Event Status: Betzaira Mentel Notes Patient appears to be doing very  well regard to her HBO therapy she is nearing the end of her course. There does not appear to be any evidence of complication and the good news is she does seem to be doing better. HBO Attestation Yes DEDRIA, LAFEVER ([id number]) I certify that I supervised this HBO treatment in accordance with Medicare guidelines. A trained emergency response team is readily available per hospital policies and procedures. Continue HBOT as ordered. Yes Electronic Signature(s) Signed: 10/29/2017 11:19:49 AM By: [name] PA-C Previous Signature: 10/26/2017 12:16:05 PM Version By: Dayton Martes RCP, RRT, CHT Entered By: [name] on 10/26/2017 17:58:04 Shari Turner ([id number]) -------------------------------------------------------------------------------- HBO Safety Checklist Details Patient Name: Shari Turner Date of Service: 10/26/2017 10:00 AM Medical Record Number: [id number] Patient Account Number: [id number] Date of Birth/Sex: [birth year] (77 y.o. Female) Treating RN: Primary Care Doralene Glanz: [name] Other Clinician: [name] Referring Felishia Wartman: [name] Treating Jasmin Trumbull/Extender: [name], [name] Weeks in Treatment: 13 HBO Safety Checklist Items Safety Checklist Consent Form Signed Patient voided / foley secured and emptied When did you last eato 10/26/17 am Last dose of injectable or oral agent 10/26/17 am Ostomy pouch emptied and vented if applicable NA All implantable devices assessed, documented and approved NA Intravenous access site secured and place NA Valuables secured Linens and cotton and cotton/polyester blend (less than 51% polyester) Personal oil-based products / skin lotions / body lotions removed Wigs or hairpieces removed NA Smoking or tobacco materials removed NA Books / newspapers / magazines / loose paper removed Cologne, aftershave, perfume and deodorant removed Jewelry removed (may wrap wedding band) Make-up  removed Hair care products removed Battery operated devices (external) removed Heating patches and chemical warmers removed Titanium eyewear removed NA Nail polish cured greater than 10 hours NA Casting material cured greater than 10 hours NA Hearing aids removed NA Loose dentures  or partials removed NA Prosthetics have been removed NA Patient demonstrates correct use of air break device (if applicable) Patient concerns have been addressed Patient grounding bracelet on and cord attached to chamber Specifics for Inpatients (complete in addition to above) Medication sheet sent with patient Intravenous medications needed or due during therapy sent with patient Drainage tubes (e.g. nasogastric tube or chest tube secured and vented) Endotracheal or Tracheotomy tube secured Cuff deflated of air and inflated with saline Airway suctioned Electronic Signature(s) Signed: 10/26/2017 12:16:05 PM By: Dayton Martes RCP, RRT, CHT Conde, [name] ([id number]) Entered By: Dayton Martes on 10/26/2017 09:58:40

## 2017-10-30 ENCOUNTER — Encounter: Payer: Medicare Other | Admitting: Physician Assistant

## 2017-10-30 DIAGNOSIS — E11622 Type 2 diabetes mellitus with other skin ulcer: Secondary | ICD-10-CM | POA: Diagnosis not present

## 2017-10-30 LAB — GLUCOSE, CAPILLARY
Glucose-Capillary: 196 mg/dL — ABNORMAL HIGH (ref 65–99)
Glucose-Capillary: 172 mg/dL — ABNORMAL HIGH (ref 65–99)

## 2017-10-30 NOTE — Progress Notes (Addendum)
NYKERRIA, MACCONNELL ([id number]) Visit Report for 10/30/2017 Arrival Information Details Patient Name: ANNIBELLE, BRAZIE Date of Service: 10/30/2017 10:00 AM Medical Record Number: [id number] Patient Account Number: [id number] Date of Birth/Sex: May 16, 1941 (77 y.o. Female) Treating RN: Cornell Barman Primary Care Shawna Kiener: Gershon Crane Other Clinician: Jacqulyn Bath Referring Cono Gebhard: Gershon Crane Treating Kinslie Hove/Extender: Melburn Hake, [name] Weeks in Treatment: 24 Visit Information History Since Last Visit Added or deleted any medications: No Patient Arrived: Wheel Chair Any new allergies or adverse reactions: No Arrival Time: 09:46 Had a fall or experienced change in No Accompanied By: granddaughter activities of daily living that may affect Transfer Assistance: None risk of falls: Patient Identification Verified: Yes Signs or symptoms of abuse/neglect since last visito No Secondary Verification Process Yes Hospitalized since last visit: No Completed: Pain Present Now: No Patient Has Alerts: Yes Patient Alerts: Patient on Blood Thinner DMII Eliquis Electronic Signature(s) Signed: 10/30/2017 9:47:34 AM By: Gretta Cool, [name], RN, CWS, [name], [name] Previous Signature: 10/30/2017 9:47:29 AM Version By: Gretta Cool, [name], RN, CWS, [name], [name] Entered By: Gretta Cool, [name], RN, CWS, Kim on 10/30/2017 09:47:34 [name] ([id number]) -------------------------------------------------------------------------------- Encounter Discharge Information Details Patient Name: [name] Date of Service: 10/30/2017 10:00 AM Medical Record Number: [id number] Patient Account Number: [id number] Date of Birth/Sex: 1941/09/13 (77 y.o. Female) Treating RN: Cornell Barman Primary Care Sylvia Kondracki: Gershon Crane Other Clinician: Jacqulyn Bath Referring Amadi Frady: Gershon Crane Treating Iran Rowe/Extender: Melburn Hake, [name] Weeks in Treatment: 13 Encounter Discharge Information Items Discharge Pain Level:  0 Discharge Condition: Stable Ambulatory Status: Wheelchair Discharge Destination: Home Private Transportation: Auto Accompanied By: daughter Schedule Follow-up Appointment: Yes Medication Reconciliation completed and Yes provided to Patient/Care Deairra Halleck: Clinical Summary of Care: Electronic Signature(s) Signed: 10/30/2017 12:05:13 PM By: Gretta Cool, [name], RN, CWS, [name], [name] Entered By: Gretta Cool, [name], RN, CWS, Kim on 10/30/2017 12:05:12 [name] ([id number]) -------------------------------------------------------------------------------- Patient/Caregiver Education Details Patient Name: [name] Date of Service: 10/30/2017 10:00 AM Medical Record Number: [id number] Patient Account Number: [id number] Date of Birth/Gender: 07/24/1941 (77 y.o. Female) Treating RN: Cornell Barman Primary Care Physician: Gershon Crane Other Clinician: Jacqulyn Bath Referring Physician: Gershon Crane Treating Physician/Extender: Sharalyn Ink in Treatment: 13 Education Assessment Education Provided To: Patient Education Topics Provided Hyperbaric Oxygenation: Handouts: Hyperbaric Oxygen, Other: continue treatments Methods: Explain/Verbal Responses: State content correctly Electronic Signature(s) Signed: 11/01/2017 8:17:56 AM By: Gretta Cool, [name], RN, CWS, [name], [name] Entered By: Gretta Cool, [name], RN, CWS, Kim on 10/30/2017 12:04:58 [name] ([id number]) -------------------------------------------------------------------------------- Vitals Details Patient Name: [name] Date of Service: 10/30/2017 10:00 AM Medical Record Number: [id number] Patient Account Number: [id number] Date of Birth/Sex: 03/22/1941 (77 y.o. Female) Treating RN: Cornell Barman Primary Care Glenis Musolf: Gershon Crane Other Clinician: Jacqulyn Bath Referring Jaques Mineer: Gershon Crane Treating Eaton Folmar/Extender: Melburn Hake, [name] Weeks in Treatment: 13 Vital Signs Time Taken: 09:39 Temperature (F):  98.3 Height (in): 62 Pulse (bpm): 72 Weight (lbs): 148 Respiratory Rate (breaths/min): 16 Body Mass Index (BMI): 27.1 Blood Pressure (mmHg): 128/64 Capillary Blood Glucose (mg/dl): 196 Reference Range: 80 - 120 mg / dl Electronic Signature(s) Signed: 10/30/2017 9:47:59 AM By: Gretta Cool, [name], RN, CWS, [name], [name] Entered By: Gretta Cool, [name], RN, CWS, Kim on 10/30/2017 09:47:59

## 2017-10-30 NOTE — Progress Notes (Signed)
JERRE, DIGUGLIELMO ([id number]) Visit Report for 10/29/2017 Arrival Information Details Patient Name: Shari Turner, Shari Turner Date of Service: 10/29/2017 10:00 AM Medical Record Number: [id number] Patient Account Number: [id number] Date of Birth/Sex: 1941/05/05 (76 y.o. Female) Treating RN: Montey Hora Primary Care Sondos Wolfman: Gershon Crane Other Clinician: Jacqulyn Bath Referring Giani Betzold: Gershon Crane Treating Taris Galindo/Extender: Melburn Hake, [name] Weeks in Treatment: 28 Visit Information History Since Last Visit Added or deleted any medications: No Patient Arrived: Wheel Chair Any new allergies or adverse reactions: No Arrival Time: 09:30 Had a fall or experienced change in No Accompanied By: granddaughter activities of daily living that may affect Transfer Assistance: Manual risk of falls: Patient Identification Verified: Yes Signs or symptoms of abuse/neglect since last visito No Secondary Verification Process Yes Hospitalized since last visit: No Completed: Pain Present Now: No Patient Has Alerts: Yes Patient Alerts: Patient on Blood Thinner DMII Eliquis Electronic Signature(s) Signed: 10/29/2017 4:52:11 PM By: Montey Hora Entered By: Montey Hora on 10/29/2017 10:28:50 Shari Turner ([id number]) -------------------------------------------------------------------------------- Encounter Discharge Information Details Patient Name: Shari Turner Date of Service: 10/29/2017 10:00 AM Medical Record Number: [id number] Patient Account Number: [id number] Date of Birth/Sex: Feb 01, 1941 (76 y.o. Female) Treating RN: Montey Hora Primary Care Uziah Sorter: Gershon Crane Other Clinician: Jacqulyn Bath Referring Dayten Juba: Gershon Crane Treating Valena Ivanov/Extender: Melburn Hake, [name] Weeks in Treatment: 62 Encounter Discharge Information Items Discharge Pain Level: 0 Discharge Condition: Stable Ambulatory Status: Wheelchair Discharge Destination: Home Transportation: Private  Auto Accompanied By: granddaughter Schedule Follow-up Appointment: Yes Medication Reconciliation completed and No provided to Patient/Care Vali Capano: Clinical Summary of Care: Notes Patient has an HBO treatment scheduled on 10/30/17 at 10:00 am. Electronic Signature(s) Signed: 10/29/2017 4:52:11 PM By: Montey Hora Entered By: Montey Hora on 10/29/2017 12:02:12 Shari Turner ([id number]) -------------------------------------------------------------------------------- Vitals Details Patient Name: Shari Turner Date of Service: 10/29/2017 10:00 AM Medical Record Number: [id number] Patient Account Number: [id number] Date of Birth/Sex: 1941-05-20 (76 y.o. Female) Treating RN: Montey Hora Primary Care Anisten Tomassi: Gershon Crane Other Clinician: Jacqulyn Bath Referring Dustyn Dansereau: Gershon Crane Treating Dewaine Morocho/Extender: Melburn Hake, [name] Weeks in Treatment: 13 Vital Signs Time Taken: 09:35 Temperature (F): 98.0 Height (in): 62 Pulse (bpm): 78 Weight (lbs): 148 Respiratory Rate (breaths/min): 16 Body Mass Index (BMI): 27.1 Blood Pressure (mmHg): 122/70 Capillary Blood Glucose (mg/dl): 226 Reference Range: 80 - 120 mg / dl Electronic Signature(s) Signed: 10/29/2017 4:52:11 PM By: Montey Hora Entered By: Montey Hora on 10/29/2017 10:29:27

## 2017-10-30 NOTE — Progress Notes (Signed)
ARNETIA, FUSILIER ([id number]) Visit Report for 10/29/2017 HBO Details Patient Name: Shari Turner, Shari Turner Date of Service: 10/29/2017 10:00 AM Medical Record Number: [id number] Patient Account Number: [id number] Date of Birth/Sex: [birth year] (76 y.o. 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 HBO Treatment Details Treatment Number: 36 Patient Type: Outpatient Chamber Type: Monoplace Chamber Serial #: [id number] Treatment Protocol: 2.0 ATA with 90 minutes oxygen, and no air breaks Treatment Details Compression Rate Down: 1.5 psi / minute De-Compression Rate Up: 1.5 psi / minute Compress Tx Pressure Air breaks and breathing periods Decompress Decompress Begins Reached (leave unused spaces blank) Begins Ends Chamber Pressure (ATA) 1 2 - - - - - - 2 1 Clock Time (24 hr) 09:58 10:08 - - - - - - 11:38 11:48 Treatment Length: 110 (minutes) Treatment Segments: 4 Capillary Blood Glucose Pre Capillary Blood Glucose (mg/dl): Post Capillary Blood Glucose (mg/dl): Vital Signs Capillary Blood Glucose Reference Range: 80 - 120 mg / dl HBO Diabetic Blood Glucose Intervention Range: <131 mg/dl or >132 mg/dl Time Vitals Blood Respiratory Capillary Blood Glucose Pulse Action Type: Pulse: Temperature: Taken: Pressure: Rate: Glucose (mg/dl): Meter #: Oximetry (%) Taken: Pre 09:35 122/70 78 16 98 226 none per protocol Post 11:55 144/76 96 16 97.8 181 none per protocol Treatment Response Treatment Toleration: Well Treatment Completion Treatment Completed without Adverse Event Status: HBO Attestation I certify that I  supervised this HBO treatment in accordance with Medicare guidelines. A trained emergency response Yes team is readily available per hospital policies and procedures. Continue HBOT as ordered. HAANI, KOFFMAN ([id number]) Electronic Signature(s) Signed: 10/29/2017 4:26:52 PM By: [name] PA-C Entered By: [name] on 10/29/2017 16:26:23 Shari Turner ([id number]) -------------------------------------------------------------------------------- HBO Safety Checklist Details Patient Name: Shari Turner Date of Service: 10/29/2017 10:00 AM Medical Record Number: [id number] Patient Account Number: [id number] Date of Birth/Sex: 10/08/1941 (76 y.o. Female) Treating RN: Curtis Sites Primary Care Ethelreda Sukhu: [name] Other Clinician: [name] Referring Daizy Outen: [name] Treating Shammara Jarrett/Extender: [name], [name] Weeks in Treatment: 13 HBO Safety Checklist Items Safety Checklist Consent Form Signed Patient voided / foley secured and emptied When did you last eato 10/29/17 AM Last dose of injectable or oral agent 10/29/17 AM Ostomy pouch emptied and vented if applicable NA All implantable devices assessed, documented and approved NA Intravenous access site secured and place NA Valuables secured Linens and cotton and cotton/polyester blend (less than 51% polyester) Personal oil-based products / skin lotions / body lotions removed Wigs or hairpieces removed NA Smoking or tobacco materials removed NA Books / newspapers / magazines / loose paper removed Cologne, aftershave, perfume and deodorant removed Jewelry removed (may wrap wedding band) Make-up removed Hair care products removed Battery operated devices (external) removed Heating patches and chemical warmers removed Titanium eyewear removed NA Nail polish cured greater than 10 hours NA Casting material cured greater than 10 hours NA Hearing aids removed NA Loose dentures or partials  removed NA Prosthetics have been removed NA Patient demonstrates correct use of air break device (if applicable) Patient concerns have been addressed Patient grounding bracelet on and cord attached to chamber Specifics for Inpatients (complete in addition to above) Medication sheet sent with patient Intravenous medications needed or due during therapy sent with  patient Drainage tubes (e.g. nasogastric tube or chest tube secured and vented) Endotracheal or Tracheotomy tube secured Cuff deflated of air and inflated with saline Airway suctioned Electronic Signature(s) Signed: 10/29/2017 4:52:11 PM By: Bennett Scrape, [name] ([id number]) Entered By: Curtis Sites on 10/29/2017 10:32:50

## 2017-10-30 NOTE — Progress Notes (Addendum)
Shari Turner, Shari Turner ([id number]) Visit Report for 10/30/2017 HBO Details Patient Name: Shari Turner, Shari Turner Date of Service: 10/30/2017 10:00 AM Medical Record Number: [id number] Patient Account Number: [id number] Date of Birth/Sex: 11/21/1940 (77 y.o. 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 HBO Treatment Details Treatment Number: 37 Patient Type: Outpatient Chamber Type: Monoplace Chamber Serial #: [id number] Treatment Protocol: 2.0 ATA with 90 minutes oxygen, and no air breaks Treatment Details Compression Rate Down: 1.5 psi / minute De-Compression Rate Up: 1.5 psi / minute Compress Tx Pressure Air breaks and breathing periods Decompress Decompress Begins Reached (leave unused spaces blank) Begins Ends Chamber Pressure (ATA) 1 2 - - - - - - 2 1 Clock Time (24 hr) 09:58 10:09 - - - - - - 11:39 11:51 Treatment Length: 113 (minutes) Treatment Segments: 4 Capillary Blood Glucose Pre Capillary Blood Glucose (mg/dl): Post Capillary Blood Glucose (mg/dl): Vital Signs Capillary Blood Glucose Reference Range: 80 - 120 mg / dl HBO Diabetic Blood Glucose Intervention Range: <131 mg/dl or >841 mg/dl Time Vitals Blood Respiratory Capillary Blood Glucose Pulse Action Type: Pulse: Temperature: Taken: Pressure: Rate: Glucose (mg/dl): Meter #: Oximetry (%) Taken: Pre 09:39 128/64 72 16 98.3 196 Post 11:55 136/78 68 16 97.6 172 Treatment Response Treatment Toleration: Well Treatment Completion Treatment Completed without Adverse Event Status: Shari Turner Notes There were no issues or concerns with HBO therapy today HBO  Attestation I certify that I supervised this HBO treatment in accordance with Medicare guidelines. A trained emergency response Yes team is readily available per hospital policies and procedures. Shari Turner ([id number]) Continue HBOT as ordered. Yes Electronic Signature(s) Signed: 10/31/2017 7:56:11 AM By: [name] PA-C Previous Signature: 10/30/2017 12:03:54 PM Version By: [name] [name], RN, CWS, [name], [name] Previous Signature: 10/30/2017 11:41:35 AM Version By: [name] [name], RN, CWS, [name], [name] Previous Signature: 10/30/2017 10:09:23 AM Version By: [name] [name], RN, CWS, [name], [name] Previous Signature: 10/30/2017 10:03:47 AM Version By: [name], [name], RN, CWS, [name], [name] Entered By: [name] on 10/30/2017 17:25:32 Shari Turner ([id number]) -------------------------------------------------------------------------------- HBO Safety Checklist Details Patient Name: Shari Turner Date of Service: 10/30/2017 10:00 AM Medical Record Number: [id number] Patient Account Number: [id number] Date of Birth/Sex: Shari 02, 1942 (77 y.o. Female) Treating RN: [name] Primary Care Samari Bittinger: [name] Other Clinician: [name] Referring Janmichael Giraud: [name] Treating Fordyce Lepak/Extender: [name], [name] Weeks in Treatment: 13 HBO Safety Checklist Items Safety Checklist Consent Form Signed Patient voided / foley secured and emptied When did you last eato morning Last dose of injectable or oral agent morning Ostomy pouch emptied and vented if applicable NA All implantable devices assessed, documented and approved NA Intravenous access site secured and place NA Valuables secured Linens and cotton and cotton/polyester blend (less than 51% polyester) Personal oil-based products / skin lotions / body lotions removed Wigs or hairpieces removed NA Smoking or tobacco materials removed NA Books / newspapers / magazines / loose paper removed Cologne, aftershave, perfume and  deodorant removed Jewelry removed (Shari wrap wedding band) Make-up removed Hair care products removed Battery operated devices (external) removed NA Heating patches and chemical warmers removed NA Titanium eyewear removed NA Nail polish cured greater than 10 hours Casting material cured greater than 10 hours NA  Hearing aids removed NA Loose dentures or partials removed Prosthetics have been removed NA Patient demonstrates correct use of air break device (if applicable) Patient concerns have been addressed Patient grounding bracelet on and cord attached to chamber Specifics for Inpatients (complete in addition to above) Medication sheet sent with patient Intravenous medications needed or due during therapy sent with patient Drainage tubes (e.g. nasogastric tube or chest tube secured and vented) Endotracheal or Tracheotomy tube secured Cuff deflated of air and inflated with saline Airway suctioned Electronic Signature(s) Signed: 10/30/2017 9:58:39 AM By: [name], [name], RN, CWS, [name], [name] Previous Signature: 10/30/2017 9:58:32 AM Version By: [name], [name], RN, CWS, [name], [name] [name], Shari Turner ([id number]) Entered By: [name], [name], RN, CWS, Kim on 10/30/2017 09:58:38

## 2017-10-30 NOTE — Progress Notes (Signed)
Shari Turner, Shari Turner ([id number]) Visit Report for 10/29/2017 Problem List Details Patient Name: Shari Turner, Shari Turner Date of Service: 10/29/2017 10:00 AM Medical Record Number: [id number] Patient Account Number: [id number] Date of Birth/Sex: 1941-03-08 (76 y.o. 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, unspecified Facility  Procedures CPT4 Code: 62035597 Description: (Facility Use Only) HBOT, full body chamber, 58min Modifier: Quantity: 4 Physician Procedures CPT4 Code: 4163845 Description: 36468 - WC PHYS HYPERBARIC OXYGEN THERAPY ICD-10 Diagnosis Description 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

## 2017-10-31 ENCOUNTER — Encounter: Payer: Medicare Other | Admitting: Internal Medicine

## 2017-10-31 DIAGNOSIS — E11622 Type 2 diabetes mellitus with other skin ulcer: Secondary | ICD-10-CM | POA: Diagnosis not present

## 2017-10-31 LAB — GLUCOSE, CAPILLARY
GLUCOSE-CAPILLARY: 199 mg/dL — AB (ref 65–99)
GLUCOSE-CAPILLARY: 166 mg/dL — AB (ref 65–99)

## 2017-10-31 NOTE — Progress Notes (Signed)
Shari Turner, Shari Turner ([id number]) Visit Report for 10/30/2017 Problem List Details Patient Name: Shari Turner, Shari Turner Date of Service: 10/30/2017 10:00 AM Medical Record Number: [id number] Patient Account Number: [id number] Date of Birth/Sex: Jun 02, 1941 (76 y.o. 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, unspecified Facility Procedures CPT4  Code: 44034742 Description: (Facility Use Only) HBOT, full body chamber, 61min Modifier: Quantity: 4 Physician Procedures CPT4 Code: 5956387 Description: 56433 - WC PHYS HYPERBARIC OXYGEN THERAPY ICD-10 Diagnosis Description M86.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

## 2017-11-01 ENCOUNTER — Encounter: Payer: Medicare Other | Admitting: Nurse Practitioner

## 2017-11-01 DIAGNOSIS — E11622 Type 2 diabetes mellitus with other skin ulcer: Secondary | ICD-10-CM | POA: Diagnosis not present

## 2017-11-01 LAB — GLUCOSE, CAPILLARY
Glucose-Capillary: 190 mg/dL — ABNORMAL HIGH (ref 65–99)
Glucose-Capillary: 192 mg/dL — ABNORMAL HIGH (ref 65–99)

## 2017-11-01 NOTE — Progress Notes (Signed)
LANA, FLAIM ([id number]) Visit Report for 10/31/2017 Arrival Information Details Patient Name: Shari Turner Date of Service: 10/31/2017 10:00 AM Medical Record Number: [id number] Patient Account Number: [id number] Date of Birth/Sex: 1941-03-22 (76 y.o. Female) Treating RN: Primary Care Tyrique Sporn: Gershon Crane Other Clinician: Jacqulyn Bath Referring Morley Gaumer: Gershon Crane Treating Marylynn Rigdon/Extender: Tito Dine in Treatment: 13 Visit Information History Since Last Visit Added or deleted any medications: No Patient Arrived: Wheel Chair Any new allergies or adverse reactions: No Arrival Time: 11:27 Had a fall or experienced change in No Accompanied By: daughter, Georgina Peer activities of daily living that may affect Transfer Assistance: Manual risk of falls: Patient Identification Verified: Yes Signs or symptoms of abuse/neglect since last visito No Secondary Verification Process Yes Hospitalized since last visit: No Completed: Pain Present Now: No Patient Has Alerts: Yes Patient Alerts: Patient on Blood Thinner DMII Eliquis Electronic Signature(s) Signed: 10/31/2017 12:06:19 PM By: Lorine Bears RCP, RRT, CHT Entered By: Lorine Bears on 10/31/2017 11:28:00 Shari Turner ([id number]) -------------------------------------------------------------------------------- Encounter Discharge Information Details Patient Name: Shari Turner Date of Service: 10/31/2017 10:00 AM Medical Record Number: [id number] Patient Account Number: [id number] Date of Birth/Sex: [birth year] (76 y.o. Female) Treating RN: Primary Care Evyn Putzier: Gershon Crane Other Clinician: Jacqulyn Bath Referring Revan Gendron: Gershon Crane Treating Cliffton Spradley/Extender: Tito Dine in Treatment: 13 Encounter Discharge Information Items Discharge Pain Level: 0 Discharge Condition: Stable Ambulatory Status: Wheelchair Discharge Destination:  Home Transportation: Private Auto daughter, Accompanied By: Georgina Peer Schedule Follow-up Appointment: No Medication Reconciliation completed and No provided to Patient/Care Grissel Tyrell: Clinical Summary of Care: Notes Patient has an HBO treatment scheduled on 11/01/17 at 10:00 am. Electronic Signature(s) Signed: 10/31/2017 12:06:19 PM By: Lorine Bears RCP, RRT, CHT Entered By: Lorine Bears on 10/31/2017 12:05:57 Shari Turner ([id number]) -------------------------------------------------------------------------------- Vitals Details Patient Name: Shari Turner Date of Service: 10/31/2017 10:00 AM Medical Record Number: [id number] Patient Account Number: [id number] Date of Birth/Sex: 1940/12/22 (76 y.o. Female) Treating RN: Primary Care Kindra Bickham: Gershon Crane Other Clinician: Jacqulyn Bath Referring Anntoinette Haefele: Gershon Crane Treating Jadyn Brasher/Extender: Tito Dine in Treatment: 13 Vital Signs Time Taken: 09:26 Temperature (F): 97.6 Height (in): 62 Pulse (bpm): 90 Weight (lbs): 148 Respiratory Rate (breaths/min): 16 Body Mass Index (BMI): 27.1 Blood Pressure (mmHg): 114/72 Capillary Blood Glucose (mg/dl): 199 Reference Range: 80 - 120 mg / dl Electronic Signature(s) Signed: 10/31/2017 12:06:19 PM By: Lorine Bears RCP, RRT, CHT Entered By: Becky Sax, Amado Nash on 10/31/2017 11:28:41

## 2017-11-01 NOTE — Progress Notes (Signed)
Shari, Turner ([id number]) Visit Report for 10/31/2017 HBO Details Patient Name: Shari Turner, Shari Turner Date of Service: 10/31/2017 10:00 AM Medical Record Number: [id number] Patient Account Number: [id number] Date of Birth/Sex: 1941/04/07 (76 y.o. 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 HBO Treatment Details Treatment Number: 38 Patient Type: Outpatient Chamber Type: Monoplace Chamber Serial #: [id number] Treatment Protocol: 2.0 ATA with 90 minutes oxygen, and no air breaks Treatment Details Compression Rate Down: 1.5 psi / minute De-Compression Rate Up: 1.5 psi / minute Compress Tx Pressure Air breaks and breathing periods Decompress Decompress Begins Reached (leave unused spaces blank) Begins Ends Chamber Pressure (ATA) 1 2 - - - - - - 2 1 Clock Time (24 hr) 09:45 09:55 - - - - - - 11:25 11:36 Treatment Length: 111 (minutes) Treatment Segments: 4 Capillary Blood Glucose Pre Capillary Blood Glucose (mg/dl): Post Capillary Blood Glucose (mg/dl): Vital Signs Capillary Blood Glucose Reference Range: 80 - 120 mg / dl HBO Diabetic Blood Glucose Intervention Range: <131 mg/dl or >629 mg/dl Time Vitals Blood Respiratory Capillary Blood Glucose Pulse Action Type: Pulse: Temperature: Taken: Pressure: Rate: Glucose (mg/dl): Meter #: Oximetry (%) Taken: Pre 09:26 114/72 90 16 97.6 199 1 none per protocol Post 11:40 118/74 72 16 97.6 166 1 none per protocol Treatment Response Treatment Completion Status: Treatment Completed without Adverse Event Takasha Vetere Notes no concerns with treatment given HBO Attestation I certify  that I supervised this HBO treatment in accordance with Medicare guidelines. A trained emergency response Yes team is readily available per hospital policies and procedures. Continue HBOT as ordered. EDELIN, RESTREPO ([id number]) Electronic Signature(s) Signed: 10/31/2017 5:00:32 PM By: Baltazar Najjar MD Previous Signature: 10/31/2017 12:06:19 PM Version By: Dayton Martes RCP, RRT, CHT Entered By: Baltazar Najjar on 10/31/2017 16:59:55 [name] ([id number]) -------------------------------------------------------------------------------- HBO Safety Checklist Details Patient Name: [name] Date of Service: 10/31/2017 10:00 AM Medical Record Number: [id number] Patient Account Number: [id number] Date of Birth/Sex: May 08, 1941 (76 y.o. Female) Treating RN: Primary Care Ladonte Verstraete: [name] Other Clinician: [name] Referring Takeshi Teasdale: [name] Treating Diego Ulbricht/Extender: Altamese [place] in Treatment: 13 HBO Safety Checklist Items Safety Checklist Consent Form Signed Patient voided / foley secured and emptied When did you last eato 10/31/17 am Last dose of injectable or oral agent 10/31/17 am Ostomy pouch emptied and vented if applicable NA All implantable devices assessed, documented and approved NA Intravenous access site secured and place NA Valuables secured Linens and cotton and cotton/polyester blend (less than 51% polyester) Personal oil-based products / skin lotions / body lotions removed Wigs or hairpieces removed NA Smoking or tobacco materials removed NA Books / newspapers / magazines / loose paper removed Cologne, aftershave, perfume and deodorant removed Jewelry removed (may wrap wedding band) Make-up removed Hair care products removed Battery operated devices (external) removed Heating patches and chemical warmers removed Titanium eyewear removed NA Nail polish cured greater than 10 hours NA Casting  material cured greater than 10 hours NA Hearing aids removed NA Loose dentures or partials removed NA Prosthetics have been removed NA Patient demonstrates correct use of air break device (if applicable) Patient concerns have been addressed Patient grounding bracelet on and cord attached to chamber Specifics for Inpatients (complete in addition to above) Medication  sheet sent with patient Intravenous medications needed or due during therapy sent with patient Drainage tubes (e.g. nasogastric tube or chest tube secured and vented) Endotracheal or Tracheotomy tube secured Cuff deflated of air and inflated with saline Airway suctioned Electronic Signature(s) Signed: 10/31/2017 12:06:19 PM By: Dayton Martes RCP, RRT, CHT [place], [name] ([id number]) Entered By: Dayton Martes on 10/31/2017 11:29:39

## 2017-11-02 ENCOUNTER — Encounter: Payer: Medicare Other | Admitting: Physician Assistant

## 2017-11-02 DIAGNOSIS — E11622 Type 2 diabetes mellitus with other skin ulcer: Secondary | ICD-10-CM | POA: Diagnosis not present

## 2017-11-02 LAB — GLUCOSE, CAPILLARY
GLUCOSE-CAPILLARY: 169 mg/dL — AB (ref 65–99)
Glucose-Capillary: 160 mg/dL — ABNORMAL HIGH (ref 65–99)

## 2017-11-02 NOTE — Progress Notes (Signed)
LEELOO, SILVERTHORNE ([id number]) Visit Report for 11/01/2017 Arrival Information Details Patient Name: Shari Turner, Shari Turner Date of Service: 11/01/2017 10:00 AM Medical Record Number: [id number] Patient Account Number: [id number] Date of Birth/Sex: [birth year] (77 y.o. Female) Treating RN: Montey Hora Primary Care Urian Martenson: Gershon Crane Other Clinician: Referring Zevin Nevares: Gershon Crane Treating Modest Draeger/Extender: Cathie Olden in Treatment: 5 Visit Information History Since Last Visit Added or deleted any medications: No Patient Arrived: Wheel Chair Any new allergies or adverse reactions: No Arrival Time: 09:35 Had a fall or experienced change in No Accompanied By: granddaughter activities of daily living that may affect Transfer Assistance: None risk of falls: Patient Identification Verified: Yes Signs or symptoms of abuse/neglect since last visito No Secondary Verification Process Yes Hospitalized since last visit: No Completed: Pain Present Now: No Patient Has Alerts: Yes Patient Alerts: Patient on Blood Thinner DMII Eliquis Electronic Signature(s) Signed: 11/01/2017 10:17:03 AM By: Montey Hora Entered By: Montey Hora on 11/01/2017 10:17:03 Shari Turner ([id number]) -------------------------------------------------------------------------------- Encounter Discharge Information Details Patient Name: Shari Turner Date of Service: 11/01/2017 10:00 AM Medical Record Number: [id number] Patient Account Number: [id number] Date of Birth/Sex: February 01, 1941 (76 y.o. Female) Treating RN: Montey Hora Primary Care Demarquis Osley: Gershon Crane Other Clinician: Referring Wasim Hurlbut: Gershon Crane Treating Corrado Hymon/Extender: Cathie Olden in Treatment: 13 Encounter Discharge Information Items Discharge Pain Level: 0 Discharge Condition: Stable Ambulatory Status: Wheelchair Discharge Destination: Home Private Transportation: Auto Accompanied By:  dtr Schedule Follow-up Appointment: Yes Medication Reconciliation completed and No provided to Patient/Care Bethanny Toelle: Clinical Summary of Care: Notes Patient has an HBO treatment scheduled on 11/02/17 at 10:00 am. Electronic Signature(s) Signed: 11/01/2017 12:39:00 PM By: Montey Hora Entered By: Montey Hora on 11/01/2017 12:39:00 Shari Turner ([id number]) -------------------------------------------------------------------------------- Vitals Details Patient Name: Shari Turner Date of Service: 11/01/2017 10:00 AM Medical Record Number: [id number] Patient Account Number: [id number] Date of Birth/Sex: Jan 13, 1941 (76 y.o. Female) Treating RN: Montey Hora Primary Care Katai Marsico: Gershon Crane Other Clinician: Referring Dailey Alberson: Gershon Crane Treating Calvert Charland/Extender: Cathie Olden in Treatment: 13 Vital Signs Time Taken: 09:37 Temperature (F): 98.5 Height (in): 62 Pulse (bpm): 68 Weight (lbs): 148 Respiratory Rate (breaths/min): 16 Body Mass Index (BMI): 27.1 Blood Pressure (mmHg): 138/64 Capillary Blood Glucose (mg/dl): 190 Reference Range: 80 - 120 mg / dl Electronic Signature(s) Signed: 11/01/2017 10:17:50 AM By: Montey Hora Entered By: Montey Hora on 11/01/2017 10:17:50

## 2017-11-02 NOTE — Progress Notes (Addendum)
Shari Turner, Shari Turner ([id number]) Visit Report for 11/01/2017 HBO Details Patient Name: Shari Turner, Shari Turner Date of Service: 11/01/2017 10:00 AM Medical Record Number: [id number] Patient Account Number: [id number] Date of Birth/Sex: 1941/01/14 (76 y.o. 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 HBO Treatment Details Treatment Number: 39 Patient Type: Outpatient Chamber Type: Monoplace Chamber Serial #: [id number] Treatment Protocol: 2.0 ATA with 90 minutes oxygen, and no air breaks Treatment Details Compression Rate Down: 1.5 psi / minute De-Compression Rate Up: 1.5 psi / minute Compress Tx Pressure Air breaks and breathing periods Decompress Decompress Begins Reached (leave unused spaces blank) Begins Ends Chamber Pressure (ATA) 1 2 - - - - - - 2 1 Clock Time (24 hr) 10:01 10:11 - - - - - - 11:42 11:52 Treatment Length: 111 (minutes) Treatment Segments: 4 Capillary Blood Glucose Pre Capillary Blood Glucose (mg/dl): Post Capillary Blood Glucose (mg/dl): Vital Signs Capillary Blood Glucose Reference Range: 80 - 120 mg / dl HBO Diabetic Blood Glucose Intervention Range: <131 mg/dl or >086 mg/dl Time Vitals Blood Respiratory Capillary Blood Glucose Pulse Action Type: Pulse: Temperature: Taken: Pressure: Rate: Glucose (mg/dl): Meter #: Oximetry (%) Taken: Pre 09:37 138/64 68 16 98.5 190 none per protocol Post 11:57 132/84 64 16 97.7 192 none per protocol Treatment Response Treatment Completion Status: Treatment Completed without Adverse Event HBO Attestation I certify that I supervised this HBO treatment in accordance with  Medicare guidelines. A trained emergency response Yes team is readily available per hospital policies and procedures. Continue HBOT as ordered. Yes Electronic Signature(s) KHYRIE, Shari Turner ([id number]) Signed: 11/01/2017 4:11:02 PM By: Bonnell Public Previous Signature: 11/01/2017 12:38:05 PM Version By: Curtis Sites Previous Signature: 11/01/2017 10:20:43 AM Version By: Curtis Sites Entered By: Bonnell Public on 11/01/2017 12:45:33 Shari Turner ([id number]) -------------------------------------------------------------------------------- HBO Safety Checklist Details Patient Name: Shari Turner Date of Service: 11/01/2017 10:00 AM Medical Record Number: [id number] Patient Account Number: [id number] Date of Birth/Sex: [birth year] (76 y.o. Female) Treating RN: Curtis Sites Primary Care Soila Printup: [name] Other Clinician: Referring Nonnie Pickney: [name] Treating Mica Releford/Extender: Kathreen Cosier in Treatment: 13 HBO Safety Checklist Items Safety Checklist Consent Form Signed Patient voided / foley secured and emptied When did you last eato 11/01/17 AM Last dose of injectable or oral agent 11/01/17 AM Ostomy pouch emptied and vented if applicable NA All implantable devices assessed, documented and approved NA Intravenous access site secured and place NA Valuables secured Linens and cotton and cotton/polyester blend (less than 51% polyester) Personal oil-based products / skin lotions / body lotions removed Wigs or hairpieces removed NA Smoking or tobacco materials removed NA Books / newspapers / magazines / loose paper removed Cologne, aftershave, perfume and deodorant removed Jewelry removed (may wrap wedding band) Make-up removed Hair care products removed Battery operated devices (external) removed Heating patches and chemical warmers removed Titanium eyewear removed NA Nail polish cured greater than 10 hours NA Casting material cured greater than 10  hours NA Hearing aids removed NA Loose dentures or partials removed NA Prosthetics have been removed NA Patient demonstrates correct use of air break device (if applicable) Patient concerns have been addressed Patient grounding bracelet on and cord attached to chamber Specifics for Inpatients (complete in addition to above) Medication sheet sent with patient Intravenous medications needed  or due during therapy sent with patient Drainage tubes (e.g. nasogastric tube or chest tube secured and vented) Endotracheal or Tracheotomy tube secured Cuff deflated of air and inflated with saline Airway suctioned Electronic Signature(s) Signed: 11/01/2017 10:19:22 AM By: Shari Turner, Shari Turner ([id number]) Entered By: Curtis Sites on 11/01/2017 10:19:22

## 2017-11-03 NOTE — Progress Notes (Signed)
GENETTE, HUERTAS ([id number]) Visit Report for 11/02/2017 Arrival Information Details Patient Name: Shari Turner, Shari Turner Date of Service: 11/02/2017 10:00 AM Medical Record Number: [id number] Patient Account Number: [id number] Date of Birth/Sex: 1941-02-21 (77 y.o. Female) Treating RN: Cornell Barman Primary Care Alivya Wegman: Gershon Crane Other Clinician: Referring Miia Blanks: Gershon Crane Treating Allia Wiltsey/Extender: Melburn Hake, [name] Weeks in Treatment: 14 Visit Information History Since Last Visit Added or deleted any medications: No Patient Arrived: Wheel Chair Any new allergies or adverse reactions: No Arrival Time: 09:30 Had a fall or experienced change in No Accompanied By: granddaughter activities of daily living that may affect Transfer Assistance: Manual risk of falls: Patient Identification Verified: Yes Signs or symptoms of abuse/neglect since last visito No Secondary Verification Process Yes Hospitalized since last visit: No Completed: Pain Present Now: No Patient Has Alerts: Yes Patient Alerts: Patient on Blood Thinner DMII Eliquis Electronic Signature(s) Signed: 11/02/2017 9:48:36 AM By: Gretta Cool, [name], RN, CWS, [name], [name] Entered By: Gretta Cool, [name], RN, CWS, Kim on 11/02/2017 09:48:36 Shari Turner ([id number]) -------------------------------------------------------------------------------- Encounter Discharge Information Details Patient Name: Shari Turner Date of Service: 11/02/2017 10:00 AM Medical Record Number: [id number] Patient Account Number: [id number] Date of Birth/Sex: August 22, 1941 (76 y.o. Female) Treating RN: Cornell Barman Primary Care Starling Christofferson: Gershon Crane Other Clinician: Referring Aziah Brostrom: Gershon Crane Treating Ryiah Bellissimo/Extender: Melburn Hake, [name] Weeks in Treatment: 14 Encounter Discharge Information Items Discharge Pain Level: 0 Discharge Condition: Stable Ambulatory Status: Wheelchair Discharge Destination: Home Transportation: Private  Auto Accompanied By: granddaughter Schedule Follow-up Appointment: No Medication Reconciliation completed and Yes provided to Patient/Care Ferris Fielden: Clinical Summary of Care: Electronic Signature(s) Signed: 11/02/2017 12:11:37 PM By: Gretta Cool, [name], RN, CWS, [name], [name] Entered By: Gretta Cool, [name], RN, CWS, Kim on 11/02/2017 12:11:37 Shari Turner ([id number]) -------------------------------------------------------------------------------- Cook Details Patient Name: Shari Turner Date of Service: 11/02/2017 10:00 AM Medical Record Number: [id number] Patient Account Number: [id number] Date of Birth/Sex: May 15, 1941 (76 y.o. Female) Treating RN: Cornell Barman Primary Care Sanjay Broadfoot: Gershon Crane Other Clinician: Referring Naika Noto: Gershon Crane Treating Kadynce Bonds/Extender: Melburn Hake, [name] Weeks in Treatment: 14 Vital Signs Time Taken: 09:35 Temperature (F): 97.9 Height (in): 62 Pulse (bpm): 84 Weight (lbs): 148 Respiratory Rate (breaths/min): 16 Body Mass Index (BMI): 27.1 Blood Pressure (mmHg): 118/68 Capillary Blood Glucose (mg/dl): 169 Reference Range: 80 - 120 mg / dl Electronic Signature(s) Signed: 11/02/2017 9:49:03 AM By: Gretta Cool, [name], RN, CWS, [name], [name] Entered By: Gretta Cool, [name], RN, CWS, Kim on 11/02/2017 09:49:03

## 2017-11-03 NOTE — Progress Notes (Addendum)
Shari Turner, Shari Turner ([id number]) Visit Report for 11/02/2017 HBO Details Patient Name: Shari Turner, Shari Turner Date of Service: 11/02/2017 10:00 AM Medical Record Number: [id number] Patient Account Number: [id number] Date of Birth/Sex: Sep 24, 1941 (76 y.o. 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 Treatment 11/02/2017 End Date: HBO Discharge Treatment Series Complete; Non-Wound Outcome: Protocol Completed with Symptom Relief HBO Treatment Details Treatment Number: 40 Patient Type: Outpatient Chamber Type: Monoplace Chamber Serial #: [id number] Treatment Protocol: 2.0 ATA with 90 minutes oxygen, and no air breaks Treatment Details Compression Rate Down: 1.5 psi / minute De-Compression Rate Up: 1.5 psi / minute Compress Tx Pressure Air breaks and breathing periods Decompress Decompress Begins Reached (leave unused spaces blank) Begins Ends Chamber Pressure (ATA) 1 2 - - - - - - 2 1 Clock Time (24 hr) 10:04 10:14 - - - - - - 11:45 10:56 Treatment Length: 52 (minutes) Treatment Segments: 2 Capillary Blood Glucose Pre Capillary Blood Glucose (mg/dl): Post Capillary Blood Glucose (mg/dl): Vital Signs Capillary Blood Glucose Reference Range: 80 - 120 mg / dl HBO Diabetic Blood Glucose Intervention Range: <131 mg/dl or >732 mg/dl Time Vitals Blood Respiratory Capillary Blood Glucose Pulse Action Type: Pulse: Temperature: Taken: Pressure: Rate: Glucose (mg/dl): Meter #: Oximetry (%) Taken: Pre 09:35 118/68 84 16 97.9 169 Post 10:58 132/78 80 16 97.8 160 Treatment Response Treatment Toleration: Well Treatment  Completion Treatment Completed without Adverse Event Status: Tekia [place] Notes Patient appears to be doing much better based on my discussion with her today in regard to her symptoms of blood in the urine which is excellent news. She's has tolerated the hyperbaric option therapy extremely well. This does show definite Shari Turner, Shari Turner ([id number]) symptom relief and that is excellent news. As of today we are discharging her in regard to therapy as she has completed treatment. From a respiratory standpoint her lungs were clear to auscultation bilaterally and heart was regular rate and rhythm. Post Treatment Teed Score Post Treatment Teed Score: Left Ear Grade 0 Post Treatment Teed Score: Right Ear Grade 0 HBO Attestation I certify that I supervised this HBO treatment in accordance with Medicare guidelines. A trained emergency response Yes team is readily available per hospital policies and procedures. Continue HBOT as ordered. Yes Electronic Signature(s) Signed: 11/03/2017 12:23:43 AM By: [name] PA-C Previous Signature: 11/02/2017 12:10:33 PM Version By: [name] [name], RN, CWS, [name], [name] Previous Signature: 11/02/2017 11:56:41 AM Version By: [name], [name], RN, CWS, [name], [name] Previous Signature: 11/02/2017 10:15:26 AM Version By: [name] [name], RN, CWS, [name], [name] Previous Signature: 11/02/2017 10:08:29 AM Version By: [name], [name], RN, CWS, [name], [name] Entered By: [name] on 11/02/2017 17:17:09 Shari Turner, Shari Turner ([id number]) -------------------------------------------------------------------------------- HBO Safety Checklist Details Patient Name: Shari Turner Date of Service: 11/02/2017 10:00 AM Medical Record Number: [id number] Patient Account Number: [id number] Date of Birth/Sex: 1941-05-07 (76 y.o. Female) Treating RN: [name] Primary Care Mildred Bollard: [name] Other Clinician: Referring Fredis Malkiewicz: [name] Treating Adalida Garver/Extender: [name], [name] Weeks in  Treatment: 14 HBO Safety Checklist Items Safety Checklist Consent Form Signed Patient voided / foley secured and emptied When did you last eato morning Last dose of injectable or oral agent morning Ostomy pouch emptied and vented if applicable NA All implantable  devices assessed, documented and approved NA Intravenous access site secured and place NA Valuables secured Linens and cotton and cotton/polyester blend (less than 51% polyester) Personal oil-based products / skin lotions / body lotions removed Wigs or hairpieces removed NA Smoking or tobacco materials removed NA Books / newspapers / magazines / loose paper removed Cologne, aftershave, perfume and deodorant removed NA Jewelry removed (may wrap wedding band) Make-up removed Hair care products removed Battery operated devices (external) removed NA Heating patches and chemical warmers removed NA Titanium eyewear removed NA Nail polish cured greater than 10 hours Casting material cured greater than 10 hours NA Hearing aids removed NA Loose dentures or partials removed Prosthetics have been removed NA Patient demonstrates correct use of air break device (if applicable) Patient concerns have been addressed Patient grounding bracelet on and cord attached to chamber Specifics for Inpatients (complete in addition to above) Medication sheet sent with patient Intravenous medications needed or due during therapy sent with patient Drainage tubes (e.g. nasogastric tube or chest tube secured and vented) Endotracheal or Tracheotomy tube secured Cuff deflated of air and inflated with saline Airway suctioned Electronic Signature(s) Signed: 11/02/2017 9:49:49 AM By: [name], [name], RN, CWS, [name], [name] Shari Turner, Shari Turner ([id number]) Entered By: [name], [name], RN, CWS, Kim on 11/02/2017 09:49:49

## 2017-11-04 NOTE — Progress Notes (Signed)
Shari Turner, Shari Turner ([id number]) Visit Report for 11/02/2017 Problem List Details Patient Name: [name], [name] Date of Service: 11/02/2017 10:00 AM Medical Record Number: [id number] Patient Account Number: [id number] Date of Birth/Sex: 06-Apr-1941 (76 y.o. 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, unspecified Facility Procedures CPT4 Code:  [id number] Description: (Facility Use Only) HBOT, full body chamber, 58min Modifier: Quantity: 2 Physician Procedures CPT4 Code Description: [id number] - WC PHYS HYPERBARIC OXYGEN THERAPY ICD-10 Diagnosis Description M86.68 Other chronic osteomyelitis, other site 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

## 2017-11-05 ENCOUNTER — Encounter: Payer: Medicare Other | Admitting: Physician Assistant

## 2017-11-06 ENCOUNTER — Encounter: Payer: Medicare Other | Admitting: Nurse Practitioner

## 2017-11-06 ENCOUNTER — Encounter: Payer: Medicare Other | Admitting: Internal Medicine

## 2017-11-07 ENCOUNTER — Encounter: Payer: Medicare Other | Admitting: Internal Medicine

## 2017-11-08 ENCOUNTER — Encounter: Payer: Medicare Other | Admitting: Nurse Practitioner

## 2017-11-09 ENCOUNTER — Encounter: Payer: Medicare Other | Admitting: Physician Assistant

## 2019-01-26 IMAGING — CR DG CHEST 2V
1 series · 2 of 2 positions shown · non-contrast
Comparison: None.

CLINICAL DATA: History of osteomyelitis with HBO therapy, initial
encounter

EXAM:
CHEST  2 VIEW

[Series 1: w chest pa · 0.14mm/px · 2 of 2 slices shown]
[im 1/2]
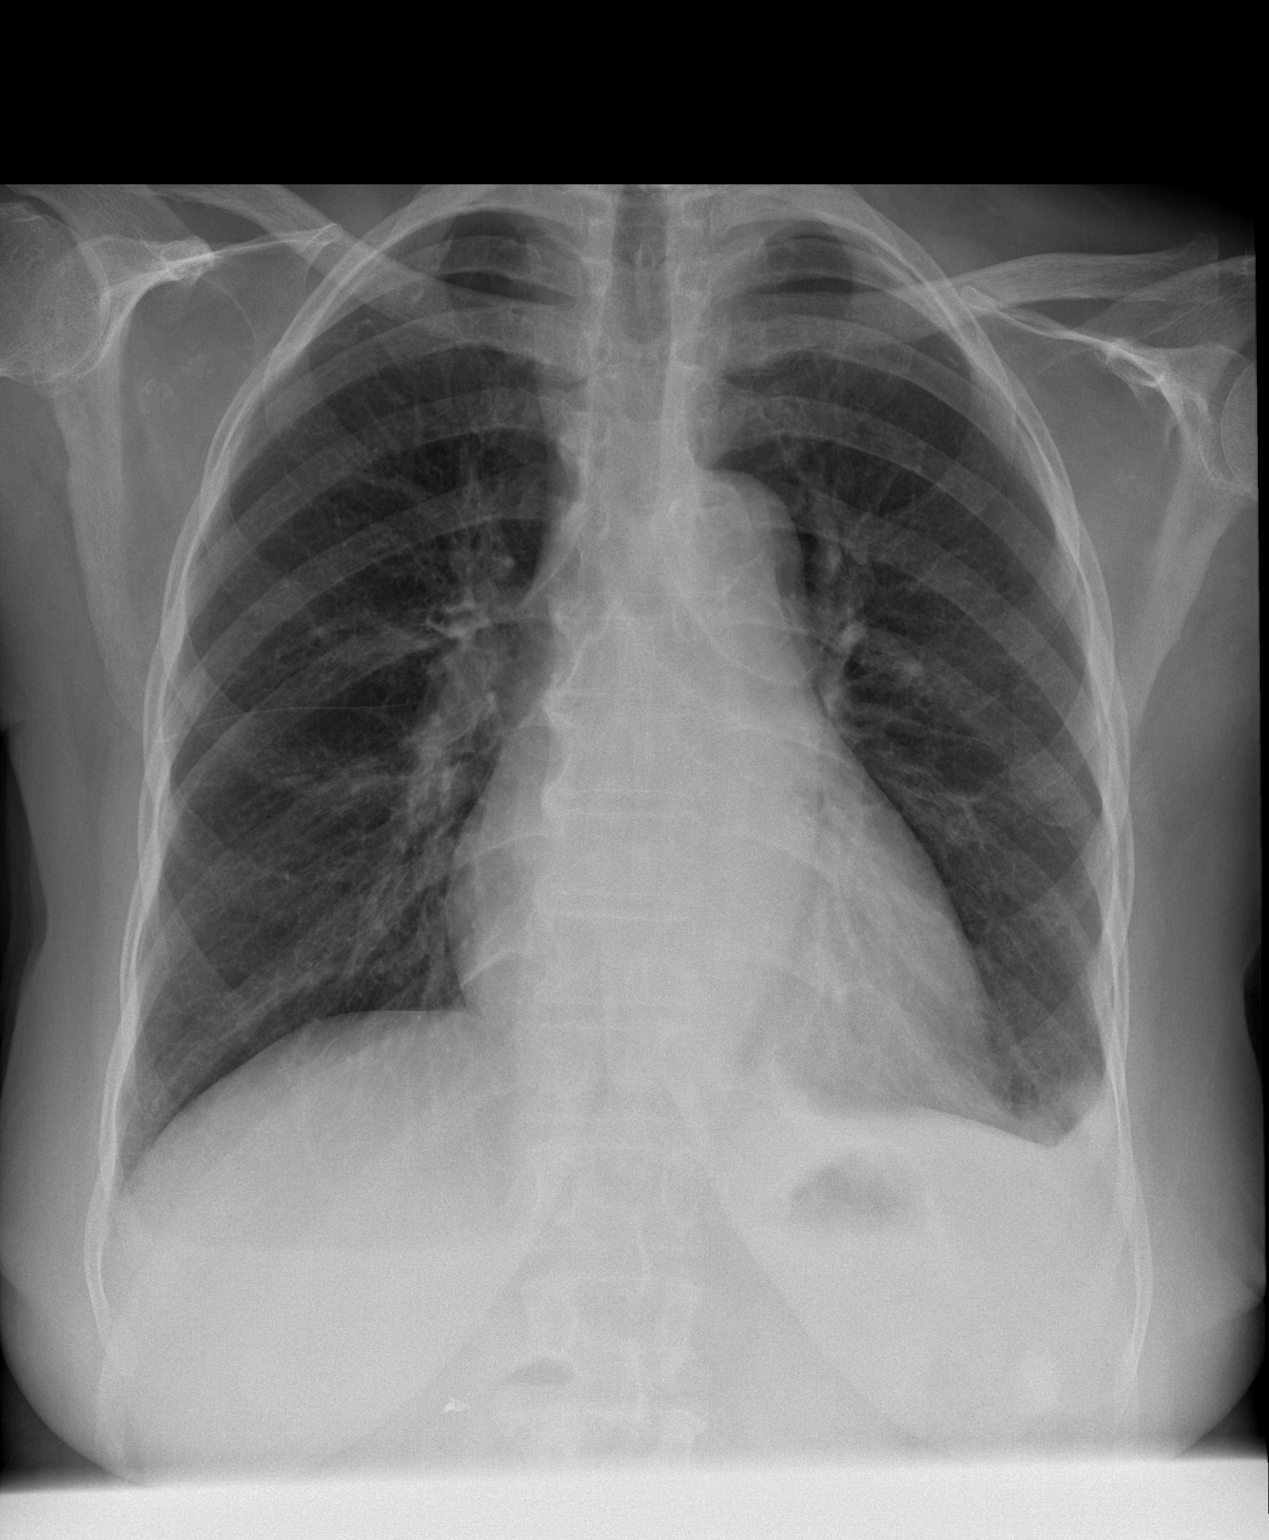
[im 2/2]
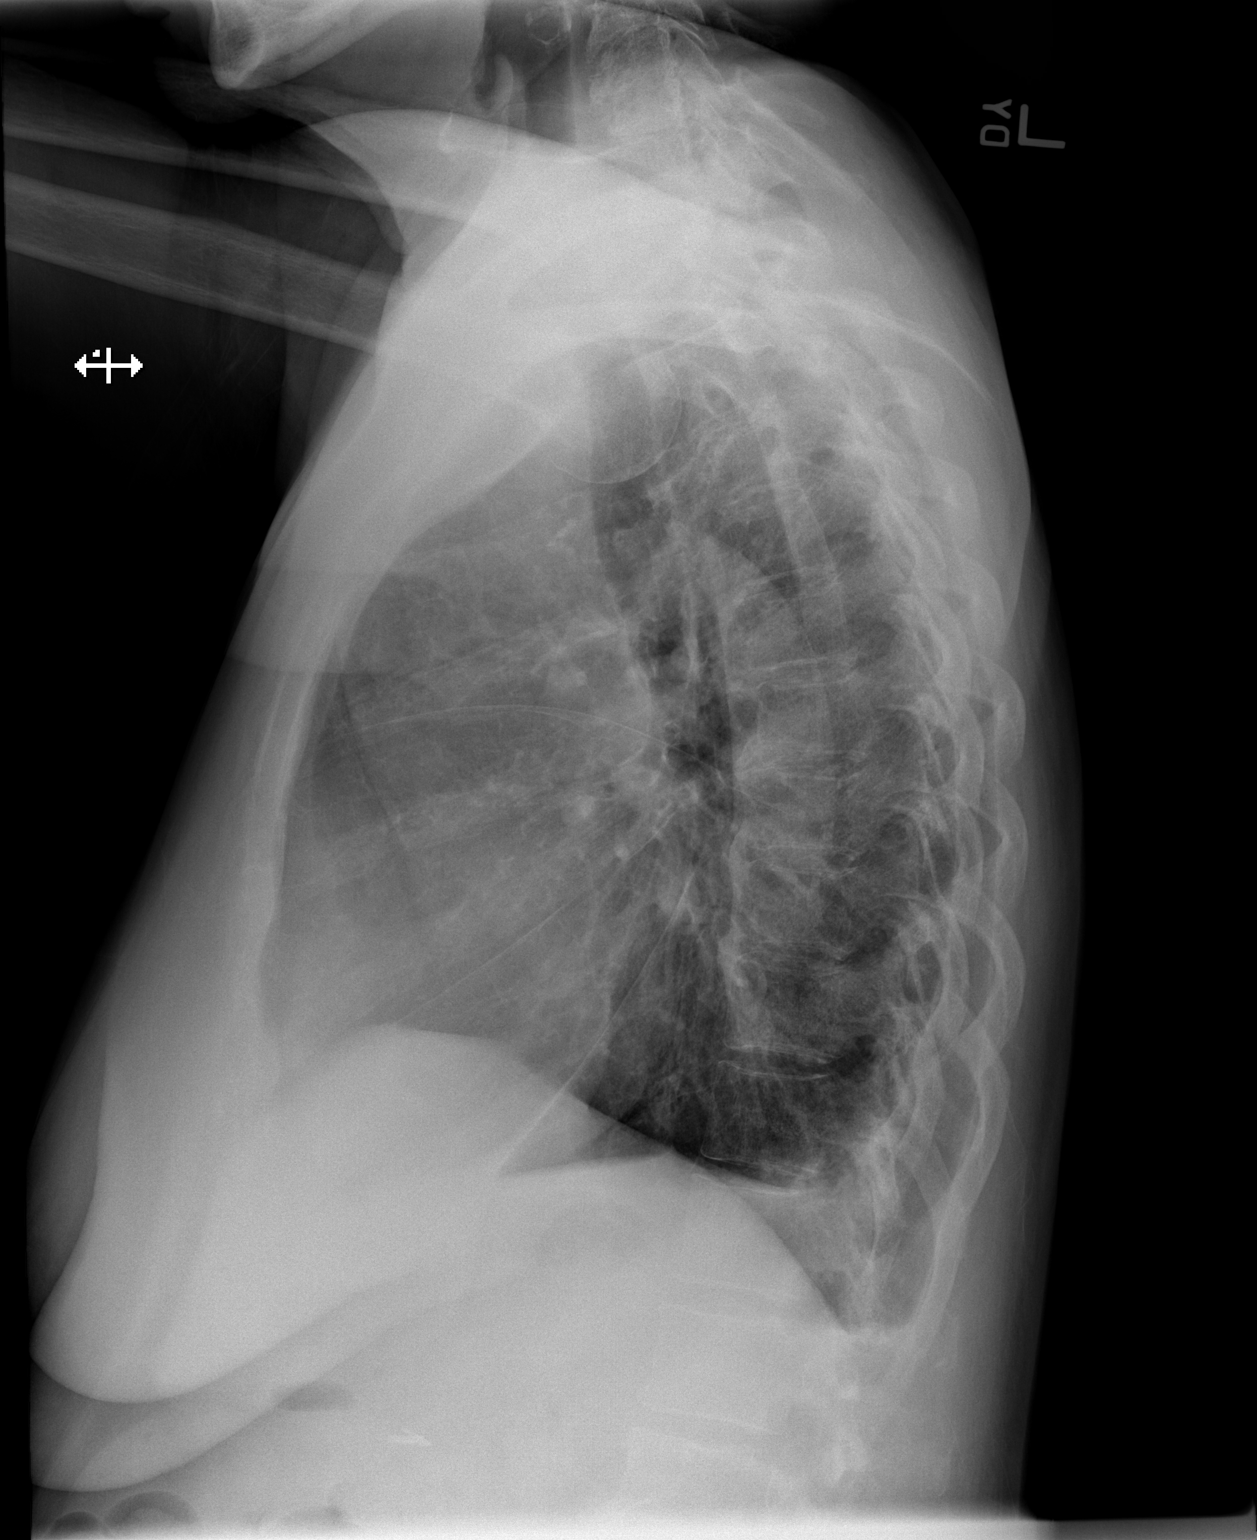

[2 of 2 positions shown; findings below may reference images not displayed]

FINDINGS: Cardiac shadow is at the upper limits of normal in size. The lungs
are well aerated bilaterally. Small left pleural effusion and left
basilar atelectasis is seen. No focal confluent infiltrate is noted.
No acute bony abnormality is noted.
IMPRESSION: Mild left basilar changes as described.

## 2019-03-05 ENCOUNTER — Encounter: Payer: Medicare Other | Attending: Internal Medicine | Admitting: Internal Medicine

## 2019-03-05 ENCOUNTER — Other Ambulatory Visit: Payer: Self-pay

## 2019-03-05 DIAGNOSIS — I509 Heart failure, unspecified: Secondary | ICD-10-CM | POA: Insufficient documentation

## 2019-03-05 DIAGNOSIS — E440 Moderate protein-calorie malnutrition: Secondary | ICD-10-CM | POA: Diagnosis not present

## 2019-03-05 DIAGNOSIS — I110 Hypertensive heart disease with heart failure: Secondary | ICD-10-CM | POA: Diagnosis not present

## 2019-03-05 DIAGNOSIS — E1140 Type 2 diabetes mellitus with diabetic neuropathy, unspecified: Secondary | ICD-10-CM | POA: Diagnosis not present

## 2019-03-05 DIAGNOSIS — Z9221 Personal history of antineoplastic chemotherapy: Secondary | ICD-10-CM | POA: Diagnosis not present

## 2019-03-05 DIAGNOSIS — Z923 Personal history of irradiation: Secondary | ICD-10-CM | POA: Diagnosis not present

## 2019-03-05 DIAGNOSIS — L89323 Pressure ulcer of left buttock, stage 3: Secondary | ICD-10-CM | POA: Insufficient documentation

## 2019-03-05 DIAGNOSIS — Z8542 Personal history of malignant neoplasm of other parts of uterus: Secondary | ICD-10-CM | POA: Diagnosis not present

## 2019-03-05 DIAGNOSIS — Z6823 Body mass index (BMI) 23.0-23.9, adult: Secondary | ICD-10-CM | POA: Insufficient documentation

## 2019-03-05 DIAGNOSIS — L89313 Pressure ulcer of right buttock, stage 3: Secondary | ICD-10-CM | POA: Insufficient documentation

## 2019-03-05 NOTE — Progress Notes (Signed)
Shari Turner, Shari Turner ([id number]) Visit Report for 03/05/2019 Abuse/Suicide Risk Screen Details Patient Name: Shari Turner, Shari Turner Date of Service: 03/05/2019 2:15 PM Medical Record Number: [id number] Patient Account Number: [id number] Date of Birth/Sex: 02-Feb-1941 (78 y.o. F) Treating RN: Curtis Sites Primary Care Chas Axel: SYSTEM, PCP Other Clinician: Referring Serge Main: [name] Treating Yon Schiffman/Extender: Maxwell Caul Weeks in Treatment: 0 Abuse/Suicide Risk Screen Items Answer ABUSE RISK SCREEN: Has anyone close to you tried to hurt or harm you recentlyo No Do you feel uncomfortable with anyone in your familyo No Has anyone forced you do things that you didnot want to doo No Electronic Signature(s) Signed: 03/05/2019 4:53:50 PM By: Curtis Sites Entered By: Curtis Sites on 03/05/2019 14:36:56 Shari Turner ([id number]) -------------------------------------------------------------------------------- Activities of Daily Living Details Patient Name: Shari Turner Date of Service: 03/05/2019 2:15 PM Medical Record Number: [id number] Patient Account Number: [id number] Date of Birth/Sex: 1941-01-25 (78 y.o. F) Treating RN: Curtis Sites Primary Care Abimelec Grochowski: SYSTEM, PCP Other Clinician: Referring Aryella Besecker: [name] Treating Elissia Spiewak/Extender: Altamese [place] in Treatment: 0 Activities of Daily Living Items Answer Activities of Daily Living (Please select one for each item) Drive Automobile Not Able Take Medications Need Assistance Use Telephone Completely Able Care for Appearance Need Assistance Use Toilet Need Assistance Bath / Shower Need Assistance Dress Self Need Assistance Feed Self Completely Able Walk Need Assistance Get In / Out Bed Need Assistance Housework Not Able Prepare Meals Not Able Handle Money Need Assistance Shop for Self Not Able Electronic Signature(s) Signed: 03/05/2019 4:53:50 PM By: Curtis Sites Entered By: Curtis Sites  on 03/05/2019 14:37:51 Shari Turner ([id number]) -------------------------------------------------------------------------------- Education Screening Details Patient Name: Shari Turner Date of Service: 03/05/2019 2:15 PM Medical Record Number: [id number] Patient Account Number: [id number] Date of Birth/Sex: [birth year] (77 y.o. F) Treating RN: Curtis Sites Primary Care Taylynn Easton: SYSTEM, PCP Other Clinician: Referring Dauntae Derusha: [name] Treating Malley Hauter/Extender: Altamese [place] in Treatment: 0 Primary Learner Assessed: Patient Learning Preferences/Education Level/Primary Language Learning Preference: Explanation, Demonstration Highest Education Level: High School Preferred Language: English Cognitive Barrier Language Barrier: No Translator Needed: No Memory Deficit: No Emotional Barrier: No Cultural/Religious Beliefs Affecting Medical Care: No Physical Barrier Impaired Vision: No Impaired Hearing: No Decreased Hand dexterity: No Knowledge/Comprehension Knowledge Level: Medium Comprehension Level: Medium Ability to understand written Medium instructions: Ability to understand verbal Medium instructions: Motivation Anxiety Level: Calm Cooperation: Cooperative Education Importance: Acknowledges Need Interest in Health Problems: Asks Questions Perception: Coherent Willingness to Engage in Self- Medium Management Activities: Readiness to Engage in Self- Medium Management Activities: Electronic Signature(s) Signed: 03/05/2019 4:53:50 PM By: Curtis Sites Entered By: Curtis Sites on 03/05/2019 14:38:41 Shari Turner ([id number]) -------------------------------------------------------------------------------- Fall Risk Assessment Details Patient Name: Shari Turner Date of Service: 03/05/2019 2:15 PM Medical Record Number: [id number] Patient Account Number: [id number] Date of Birth/Sex: May 05, 1941 (78 y.o. F) Treating RN: Curtis Sites Primary  Care Shekia Kuper: SYSTEM, PCP Other Clinician: Referring Tacuma Graffam: [name] Treating Georgi Navarrete/Extender: Altamese [place] in Treatment: 0 Fall Risk Assessment Items Have you had 2 or more falls in the last 12 monthso 0 Yes Have you had any fall that resulted in injury in the last 12 monthso 0 No FALLS RISK SCREEN History of falling - immediate or within 3 months 25 Yes Secondary diagnosis (Do you have 2 or more medical diagnoseso) 0 No Ambulatory aid None/bed rest/wheelchair/nurse 0 No Crutches/cane/walker 15 Yes Furniture 0 No Intravenous therapy Access/Saline/Heparin Lock 0 No Gait/Transferring Normal/ bed rest/ wheelchair 0 No Weak (short steps with or without shuffle, stooped but  able to lift head while 10 Yes walking, may seek support from furniture) Impaired (short steps with shuffle, may have difficulty arising from chair, head 20 Yes down, impaired balance) Mental Status Oriented to own ability 0 Yes Electronic Signature(s) Signed: 03/05/2019 4:53:50 PM By: Curtis Sites Entered By: Curtis Sites on 03/05/2019 14:39:22 Shari Turner ([id number]) -------------------------------------------------------------------------------- Foot Assessment Details Patient Name: Shari Turner Date of Service: 03/05/2019 2:15 PM Medical Record Number: [id number] Patient Account Number: [id number] Date of Birth/Sex: 05-05-1941 (78 y.o. F) Treating RN: Curtis Sites Primary Care Cookie Pore: SYSTEM, PCP Other Clinician: Referring Avyukth Bontempo: [name] Treating Joniyah Mallinger/Extender: Altamese [place] in Treatment: 0 Foot Assessment Items Site Locations + = Sensation present, - = Sensation absent, C = Callus, U = Ulcer R = Redness, W = Warmth, M = Maceration, PU = Pre-ulcerative lesion F = Fissure, S = Swelling, D = Dryness Assessment Right: Left: Other Deformity: No No Prior Foot Ulcer: No No Prior Amputation: No No Charcot Joint: No No Ambulatory Status:  Non-ambulatory Assistance Device: Wheelchair Gait: Surveyor, mining) Signed: 03/05/2019 4:53:50 PM By: Curtis Sites Entered By: Curtis Sites on 03/05/2019 14:44:59 Shari Turner ([id number]) -------------------------------------------------------------------------------- Nutrition Risk Screening Details Patient Name: Shari Turner Date of Service: 03/05/2019 2:15 PM Medical Record Number: [id number] Patient Account Number: [id number] Date of Birth/Sex: 1941-04-09 (78 y.o. F) Treating RN: Curtis Sites Primary Care Derick Semin[place]: SYSTEM, PCP Other Clinician: Referring Cipriana Biller: [name] Treating Shariff Lasky/Extender: Maxwell Caul Weeks in Treatment: 0 Height (in): 62 Weight (lbs): 148 Body Mass Index (BMI): 27.1 Nutrition Risk Screening Items Score Screening NUTRITION RISK SCREEN: I have an illness or condition that made me change the kind and/or amount of 0 No food I eat I eat fewer than two meals per day 0 No I eat few fruits and vegetables, or milk products 0 No I have three or more drinks of beer, liquor or wine almost every day 0 No I have tooth or mouth problems that make it hard for me to eat 0 No I don't always have enough money to buy the food I need 0 No I eat alone most of the time 0 No I take three or more different prescribed or over-the-counter drugs a day 1 Yes Without wanting to, I have lost or gained 10 pounds in the last six months 0 No I am not always physically able to shop, cook and/or feed myself 0 No Nutrition Protocols Good Risk Protocol 0 No interventions needed Moderate Risk Protocol High Risk Proctocol Risk Level: Good Risk Score: 1 Electronic Signature(s) Signed: 03/05/2019 4:53:50 PM By: Curtis Sites Entered By: Curtis Sites on 03/05/2019 14:39:28

## 2019-03-10 ENCOUNTER — Ambulatory Visit: Payer: Medicare Other | Admitting: Physician Assistant

## 2019-03-12 ENCOUNTER — Ambulatory Visit: Payer: Medicare Other | Admitting: Internal Medicine

## 2019-03-12 NOTE — Progress Notes (Addendum)
JEDA, SWANICK ([id number]) Visit Report for 03/05/2019 Allergy List Details Patient Name: Shari Turner, Shari Turner Date of Service: 03/05/2019 2:15 PM Medical Record Number: [id number] Patient Account Number: [id number] Date of Birth/Sex: 1941-08-26 (78 y.o. F) Treating RN: Curtis Sites Primary Care Jerard Bays: SYSTEM, PCP Other Clinician: Referring Meet Weathington: [name] Treating Karagan Lehr/Extender: Maxwell Caul Weeks in Treatment: 0 Allergies Active Allergies No Known Allergies Allergy Notes Electronic Signature(s) Signed: 03/05/2019 4:53:50 PM By: Curtis Sites Entered By: Curtis Sites on 03/05/2019 14:36:47 Shari Turner ([id number]) -------------------------------------------------------------------------------- Arrival Information Details Patient Name: Shari Turner Date of Service: 03/05/2019 2:15 PM Medical Record Number: [id number] Patient Account Number: [id number] Date of Birth/Sex: 09/12/1941 (78 y.o. F) Treating RN: Curtis Sites Primary Care Jet Armbrust: SYSTEM, PCP Other Clinician: Referring Allen Egerton: [name] Treating Kalista Laguardia/Extender: Altamese Benjamin in Treatment: 0 Visit Information Patient Arrived: Wheel Chair Arrival Time: 14:35 Accompanied By: daughter Transfer Assistance: Manual Patient Identification Verified: Yes Secondary Verification Process Yes Completed: Patient Has Alerts: Yes Patient Alerts: Patient on Blood Thinner Eliquis DMII History Since Last Visit Added or deleted any medications: No Any new allergies or adverse reactions: No Had a fall or experienced change in activities of daily living that may affect risk of falls: No Signs or symptoms of abuse/neglect since last visito No Hospitalized since last visit: No Implantable device outside of the clinic excluding cellular tissue based products placed in the center since last visit: No Electronic Signature(s) Signed: 03/05/2019 4:53:50 PM By: Curtis Sites Entered By:  Curtis Sites on 03/05/2019 14:36:17 Shari Turner ([id number]) -------------------------------------------------------------------------------- Clinic Level of Care Assessment Details Patient Name: Shari Turner Date of Service: 03/05/2019 2:15 PM Medical Record Number: [id number] Patient Account Number: [id number] Date of Birth/Sex: [birth year] (78 y.o. F) Treating RN: [name] Primary Care Mainor Hellmann: SYSTEM, PCP Other Clinician: Referring Kerwin Augustus: [name] Treating Murvin Gift/Extender: Altamese Manteo in Treatment: 0 Clinic Level of Care Assessment Items TOOL 1 Quantity Score []  - Use when EandM and Procedure is performed on INITIAL visit 0 ASSESSMENTS - Nursing Assessment / Reassessment X - General Physical Exam (combine w/ comprehensive assessment (listed just below) when 1 20 performed on new pt. evals) X- 1 25 Comprehensive Assessment (HX, ROS, Risk Assessments, Wounds Hx, etc.) ASSESSMENTS - Wound and Skin Assessment / Reassessment []  - Dermatologic / Skin Assessment (not related to wound area) 0 ASSESSMENTS - Ostomy and/or Continence Assessment and Care []  - Incontinence Assessment and Management 0 []  - 0 Ostomy Care Assessment and Management (repouching, etc.) PROCESS - Coordination of Care X - Simple Patient / Family Education for ongoing care 1 15 []  - 0 Complex (extensive) Patient / Family Education for ongoing care X- 1 10 Staff obtains Chiropractor, Records, Test Results / Process Orders []  - 0 Staff telephones HHA, Nursing Homes / Clarify orders / etc []  - 0 Routine Transfer to another Facility (non-emergent condition) []  - 0 Routine Hospital Admission (non-emergent condition) X- 1 15 New Admissions / Manufacturing engineer / Ordering NPWT, Apligraf, etc. []  - 0 Emergency Hospital Admission (emergent condition) PROCESS - Special Needs []  - Pediatric / Minor Patient Management 0 []  - 0 Isolation Patient Management []  - 0 Hearing / Language  / Visual special needs []  - 0 Assessment of Community assistance (transportation, D/C planning, etc.) []  - 0 Additional assistance / Altered mentation []  - 0 Support Surface(s) Assessment (bed, cushion, seat, etc.) Shari Turner, Shari Turner ([id number]) INTERVENTIONS - Miscellaneous []  - External ear exam 0 []  - 0 Patient Transfer (multiple staff / Nurse, adult / Similar  devices) []  - 0 Simple Staple / Suture removal (25 or less) []  - 0 Complex Staple / Suture removal (26 or more) []  - 0 Hypo/Hyperglycemic Management (do not check if billed separately) []  - 0 Ankle / Brachial Index (ABI) - do not check if billed separately Has the patient been seen at the hospital within the last three years: Yes Total Score: 85 Level Of Care: New/Established - Level 3 Electronic Signature(s) Signed: 03/11/2019 5:54:26 PM By: [name], [name], RN, CWS, [name], [name] Entered By: [name], [name], RN, CWS, Kim on 03/05/2019 15:16:01 Shari Turner ([id number]) -------------------------------------------------------------------------------- Encounter Discharge Information Details Patient Name: Shari Turner Date of Service: 03/05/2019 2:15 PM Medical Record Number: [id number] Patient Account Number: [id number] Date of Birth/Sex: 1941/07/19 (77 y.o. F) Treating RN: Arnette Norris Primary Care Kacen Mellinger: SYSTEM, PCP Other Clinician: Referring Rosa Wyly: [name] Treating Izabela Ow/Extender: Altamese Greenlee in Treatment: 0 Encounter Discharge Information Items Post Procedure Vitals Discharge Condition: Stable Temperature (F): 98.8 Ambulatory Status: Wheelchair Pulse (bpm): 86 Discharge Destination: Home Respiratory Rate (breaths/min): 16 Transportation: Private Auto Blood Pressure (mmHg): 119/56 Accompanied By: daughter Schedule Follow-up Appointment: Yes Clinical Summary of Care: Electronic Signature(s) Signed: 03/05/2019 4:36:37 PM By: Arnette Norris Entered By: Arnette Norris on 03/05/2019  15:53:19 Shari Turner ([id number]) -------------------------------------------------------------------------------- Lower Extremity Assessment Details Patient Name: Shari Turner Date of Service: 03/05/2019 2:15 PM Medical Record Number: [id number] Patient Account Number: [id number] Date of Birth/Sex: 20-Nov-1940 (78 y.o. F) Treating RN: Curtis Sites Primary Care Miquan Tandon: SYSTEM, PCP Other Clinician: Referring Karen Kinnard: [name] Treating Lielle Vandervort/Extender: Maxwell Caul Weeks in Treatment: 0 Electronic Signature(s) Signed: 03/05/2019 4:53:50 PM By: Curtis Sites Entered By: Curtis Sites on 03/05/2019 14:48:40 Shari Turner ([id number]) -------------------------------------------------------------------------------- Multi Wound Chart Details Patient Name: Shari Turner Date of Service: 03/05/2019 2:15 PM Medical Record Number: [id number] Patient Account Number: [id number] Date of Birth/Sex: 31-Aug-1941 (78 y.o. F) Treating RN: [name] Primary Care Darly Fails: SYSTEM, PCP Other Clinician: Referring Arjen Deringer: [name] Treating Ash Mcelwain/Extender: Maxwell Caul Weeks in Treatment: 0 Vital Signs Height(in): 62 Pulse(bpm): 86 Weight(lbs): 130 Blood Pressure(mmHg): 119/56 Body Mass Index(BMI): 24 Temperature(F): 98.8 Respiratory Rate 16 (breaths/min): Photos: [N/A:N/A] Wound Location: Left Gluteus Right Gluteus N/A Wounding Event: Gradually Appeared Pressure Injury N/A Primary Etiology: Pressure Ulcer Pressure Ulcer N/A Comorbid History: Anemia, Congestive Heart Anemia, Congestive Heart N/A Failure, Hypertension, Type II Failure, Hypertension, Type II Diabetes, Osteomyelitis, Diabetes, Osteomyelitis, Neuropathy Neuropathy Date Acquired: 02/03/2019 02/03/2019 N/A Weeks of Treatment: 0 0 N/A Wound Status: Open Open N/A Measurements L x W x D 3.7x2.2x0.1 2.5x1.5x0.1 N/A (cm) Area (cm) : 6.393 2.945 N/A Volume (cm) : 0.639 0.295 N/A %  Reduction in Area: 0.00% N/A N/A % Reduction in Volume: 0.00% N/A N/A Classification: Category/Stage III Category/Stage III N/A Exudate Amount: Medium Medium N/A Exudate Type: Serous Serous N/A Exudate Color: amber amber N/A Wound Margin: Flat and Intact Flat and Intact N/A Granulation Amount: Small (1-33%) Small (1-33%) N/A Granulation Quality: Pink Pink N/A Necrotic Amount: Large (67-100%) Large (67-100%) N/A Exposed Structures: Fat Layer (Subcutaneous Fat Layer (Subcutaneous N/A Tissue) Exposed: Yes Tissue) Exposed: Yes Fascia: No Fascia: No Tendon: No Tendon: No Muscle: No Muscle: No Shular, Jaquelynn ([id number]) Joint: No Joint: No Bone: No Bone: No Epithelialization: None None N/A Treatment Notes Electronic Signature(s) Signed: 03/11/2019 5:54:26 PM By: [name], [name], RN, CWS, [name], [name] Entered By: [name], [name], RN, CWS, Kim on 03/05/2019 15:02:00 Shari Turner ([id number]) -------------------------------------------------------------------------------- Multi-Disciplinary Care Plan Details Patient Name: Shari Turner Date of Service: 03/05/2019 2:15 PM  Medical Record Number: [id number] Patient Account Number: [id number] Date of Birth/Sex: Dec 24, 1940 (78 y.o. F) Treating RN: [name] Primary Care Adiana Smelcer: SYSTEM, PCP Other Clinician: Referring Ayra Hodgdon: [name] Treating Kineta Fudala/Extender: Altamese Tyrrell in Treatment: 0 Active Inactive Electronic Signature(s) Signed: 03/27/2019 8:29:32 AM By: [name], [name], RN, CWS, [name], [name] Previous Signature: 03/11/2019 5:54:26 PM Version By: [name], [name], RN, CWS, [name], [name] Entered By: [name], [name], RN, CWS, Kim on 03/27/2019 08:29:31 Shari Turner ([id number]) -------------------------------------------------------------------------------- Pain Assessment Details Patient Name: Shari Turner Date of Service: 03/05/2019 2:15 PM Medical Record Number: [id number] Patient Account Number: [id number] Date of Birth/Sex:  11-20-1940 (77 y.o. F) Treating RN: Curtis Sites Primary Care Versa Craton: SYSTEM, PCP Other Clinician: Referring Leira Regino: [name] Treating Adrieana Fennelly/Extender: Maxwell Caul Weeks in Treatment: 0 Active Problems Location of Pain Severity and Description of Pain Patient Has Paino Yes Site Locations Pain Location: Pain in Ulcers With Dressing Change: Yes Duration of the Pain. Constant / Intermittento Intermittent Pain Management and Medication Current Pain Management: Electronic Signature(s) Signed: 03/05/2019 4:53:50 PM By: Curtis Sites Entered By: Curtis Sites on 03/05/2019 14:36:39 Shari Turner ([id number]) -------------------------------------------------------------------------------- Patient/Caregiver Education Details Patient Name: Shari Turner Date of Service: 03/05/2019 2:15 PM Medical Record Number: [id number] Patient Account Number: [id number] Date of Birth/Gender: 1941-09-21 (78 y.o. F) Treating RN: [name] Primary Care Physician: SYSTEM, PCP Other Clinician: Referring Physician: [name] Treating Physician/Extender: Altamese Kanosh in Treatment: 0 Education Assessment Education Provided To: Patient Education Topics Provided Wound Debridement: Handouts: Wound Debridement Methods: Demonstration, Explain/Verbal Responses: State content correctly Wound/Skin Impairment: Handouts: Caring for Your Ulcer Methods: Demonstration, Explain/Verbal Responses: State content correctly Electronic Signature(s) Signed: 03/11/2019 5:54:26 PM By: [name], [name], RN, CWS, [name], [name] Entered By: [name], [name], RN, CWS, Kim on 03/05/2019 15:16:27 Shari Turner ([id number]) -------------------------------------------------------------------------------- Wound Assessment Details Patient Name: Shari Turner Date of Service: 03/05/2019 2:15 PM Medical Record Number: [id number] Patient Account Number: [id number] Date of Birth/Sex: 1940-12-01 (77 y.o.  F) Treating RN: Curtis Sites Primary Care Zailey Audia: SYSTEM, PCP Other Clinician: Referring Jaegar Croft: [name] Treating Charday Capetillo/Extender: Maxwell Caul Weeks in Treatment: 0 Wound Status Wound Number: 1 Primary Pressure Ulcer Etiology: Wound Location: Left Gluteus Wound Open Wounding Event: Gradually Appeared Status: Date Acquired: 02/03/2019 Comorbid Anemia, Congestive Heart Failure, Weeks Of Treatment: 0 History: Hypertension, Type II Diabetes, Osteomyelitis, Clustered Wound: No Neuropathy Photos Wound Measurements Length: (cm) 3.7 % Reduction in Width: (cm) 2.2 % Reduction in Depth: (cm) 0.1 Epithelializat Area: (cm) 6.393 Tunneling: Volume: (cm) 0.639 Undermining: Area: 0% Volume: 0% ion: None No No Wound Description Classification: Category/Stage III Foul Odor Afte Wound Margin: Flat and Intact Slough/Fibrino Exudate Amount: Medium Exudate Type: Serous Exudate Color: amber r Cleansing: No Yes Wound Bed Granulation Amount: Small (1-33%) Exposed Structure Granulation Quality: Pink Fascia Exposed: No Necrotic Amount: Large (67-100%) Fat Layer (Subcutaneous Tissue) Exposed: Yes Necrotic Quality: Adherent Slough Tendon Exposed: No Muscle Exposed: No Joint Exposed: No Bone Exposed: No Electronic Signature(s) Shari Turner, Shari Turner ([id number]) Signed: 03/05/2019 4:53:50 PM By: Curtis Sites Signed: 03/11/2019 5:54:26 PM By: [name], [name], RN, CWS, [name], [name] Entered By: [name], [name], RN, CWS, Kim on 03/05/2019 15:14:59 Shari Turner ([id number]) -------------------------------------------------------------------------------- Wound Assessment Details Patient Name: Shari Turner Date of Service: 03/05/2019 2:15 PM Medical Record Number: [id number] Patient Account Number: [id number] Date of Birth/Sex: [birth year] (78 y.o. F) Treating RN: Curtis Sites Primary Care Tory Mckissack: SYSTEM, PCP Other Clinician: Referring Estill Llerena: [name] Treating  Damiean Lukes/Extender: Maxwell Caul Weeks in Treatment: 0 Wound  Status Wound Number: 2 Primary Pressure Ulcer Etiology: Wound Location: Right Gluteus Wound Open Wounding Event: Pressure Injury Status: Date Acquired: 02/03/2019 Comorbid Anemia, Congestive Heart Failure, Weeks Of Treatment: 0 History: Hypertension, Type II Diabetes, Osteomyelitis, Clustered Wound: No Neuropathy Photos Wound Measurements Length: (cm) 2.5 % Reduction i Width: (cm) 1.5 % Reduction i Depth: (cm) 0.1 Epithelializa Area: (cm) 2.945 Tunneling: Volume: (cm) 0.295 Undermining: n Area: 0% n Volume: 0% tion: None No No Wound Description Classification: Category/Stage III Foul Odor Aft Wound Margin: Flat and Intact Slough/Fibrin Exudate Amount: Medium Exudate Type: Serous Exudate Color: amber er Cleansing: No o Yes Wound Bed Granulation Amount: Small (1-33%) Exposed Structure Granulation Quality: Pink Fascia Exposed: No Necrotic Amount: Large (67-100%) Fat Layer (Subcutaneous Tissue) Exposed: Yes Necrotic Quality: Adherent Slough Tendon Exposed: No Muscle Exposed: No Joint Exposed: No Bone Exposed: No Electronic Signature(s) Shari Turner, Shari Turner ([id number]) Signed: 03/05/2019 4:53:50 PM By: Curtis Sites Signed: 03/11/2019 5:54:26 PM By: [name], [name], RN, CWS, [name], [name] Entered By: [name], [name], RN, CWS, Kim on 03/05/2019 15:15:23 Shari Turner ([id number]) -------------------------------------------------------------------------------- Vitals Details Patient Name: Shari Turner Date of Service: 03/05/2019 2:15 PM Medical Record Number: [id number] Patient Account Number: [id number] Date of Birth/Sex: [birth year] (78 y.o. F) Treating RN: Curtis Sites Primary Care Shadeed Colberg: SYSTEM, PCP Other Clinician: Referring Ellawyn Wogan: [name] Treating Aubrii Sharpless/Extender: Altamese [place] in Treatment: 0 Vital Signs Time Taken: 14:48 Temperature (F): 98.8 Height (in): 62 Pulse  (bpm): 86 Source: Measured Respiratory Rate (breaths/min): 16 Weight (lbs): 130 Blood Pressure (mmHg): 119/56 Source: Measured Reference Range: 80 - 120 mg / dl Body Mass Index (BMI): 23.8 Electronic Signature(s) Signed: 03/05/2019 4:53:50 PM By: Curtis Sites Entered By: Curtis Sites on 03/05/2019 14:48:32
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
## 2019-03-19 ENCOUNTER — Ambulatory Visit: Payer: Medicare Other | Admitting: Internal Medicine

## 2019-06-06 ENCOUNTER — Inpatient Hospital Stay
Admission: EM | Admit: 2019-06-06 | Discharge: 2019-06-10 | DRG: 871 | Disposition: E | Payer: Medicare Other | Attending: Internal Medicine | Admitting: Internal Medicine

## 2019-06-06 ENCOUNTER — Other Ambulatory Visit: Payer: Self-pay

## 2019-06-06 ENCOUNTER — Emergency Department: Payer: Medicare Other

## 2019-06-06 DIAGNOSIS — E872 Acidosis: Secondary | ICD-10-CM | POA: Diagnosis present

## 2019-06-06 DIAGNOSIS — L89312 Pressure ulcer of right buttock, stage 2: Secondary | ICD-10-CM | POA: Diagnosis present

## 2019-06-06 DIAGNOSIS — E119 Type 2 diabetes mellitus without complications: Secondary | ICD-10-CM | POA: Diagnosis present

## 2019-06-06 DIAGNOSIS — N390 Urinary tract infection, site not specified: Secondary | ICD-10-CM | POA: Diagnosis present

## 2019-06-06 DIAGNOSIS — I110 Hypertensive heart disease with heart failure: Secondary | ICD-10-CM | POA: Diagnosis present

## 2019-06-06 DIAGNOSIS — J9601 Acute respiratory failure with hypoxia: Secondary | ICD-10-CM | POA: Diagnosis present

## 2019-06-06 DIAGNOSIS — L89154 Pressure ulcer of sacral region, stage 4: Secondary | ICD-10-CM | POA: Diagnosis present

## 2019-06-06 DIAGNOSIS — Z681 Body mass index (BMI) 19 or less, adult: Secondary | ICD-10-CM | POA: Diagnosis not present

## 2019-06-06 DIAGNOSIS — T451X5A Adverse effect of antineoplastic and immunosuppressive drugs, initial encounter: Secondary | ICD-10-CM | POA: Diagnosis present

## 2019-06-06 DIAGNOSIS — R64 Cachexia: Secondary | ICD-10-CM | POA: Diagnosis present

## 2019-06-06 DIAGNOSIS — A419 Sepsis, unspecified organism: Principal | ICD-10-CM | POA: Diagnosis present

## 2019-06-06 DIAGNOSIS — G620 Drug-induced polyneuropathy: Secondary | ICD-10-CM | POA: Diagnosis present

## 2019-06-06 DIAGNOSIS — Z66 Do not resuscitate: Secondary | ICD-10-CM | POA: Diagnosis present

## 2019-06-06 DIAGNOSIS — D62 Acute posthemorrhagic anemia: Secondary | ICD-10-CM | POA: Diagnosis present

## 2019-06-06 DIAGNOSIS — Z20828 Contact with and (suspected) exposure to other viral communicable diseases: Secondary | ICD-10-CM | POA: Diagnosis present

## 2019-06-06 DIAGNOSIS — I5022 Chronic systolic (congestive) heart failure: Secondary | ICD-10-CM | POA: Diagnosis present

## 2019-06-06 DIAGNOSIS — N179 Acute kidney failure, unspecified: Secondary | ICD-10-CM | POA: Diagnosis present

## 2019-06-06 DIAGNOSIS — I509 Heart failure, unspecified: Secondary | ICD-10-CM

## 2019-06-06 DIAGNOSIS — D638 Anemia in other chronic diseases classified elsewhere: Secondary | ICD-10-CM | POA: Diagnosis present

## 2019-06-06 DIAGNOSIS — J189 Pneumonia, unspecified organism: Secondary | ICD-10-CM | POA: Diagnosis present

## 2019-06-06 DIAGNOSIS — L8990 Pressure ulcer of unspecified site, unspecified stage: Secondary | ICD-10-CM | POA: Insufficient documentation

## 2019-06-06 DIAGNOSIS — K920 Hematemesis: Secondary | ICD-10-CM | POA: Diagnosis present

## 2019-06-06 DIAGNOSIS — Z515 Encounter for palliative care: Secondary | ICD-10-CM | POA: Diagnosis present

## 2019-06-06 DIAGNOSIS — R627 Adult failure to thrive: Secondary | ICD-10-CM | POA: Diagnosis present

## 2019-06-06 HISTORY — DX: Chronic systolic (congestive) heart failure: I50.22

## 2019-06-06 HISTORY — DX: Heart failure, unspecified: I50.9

## 2019-06-06 HISTORY — DX: Malignant neoplasm of endometrium: C54.1

## 2019-06-06 HISTORY — DX: Adult failure to thrive: R62.7

## 2019-06-06 HISTORY — DX: Type 2 diabetes mellitus without complications: E11.9

## 2019-06-06 HISTORY — DX: Anemia, unspecified: D64.9

## 2019-06-06 HISTORY — DX: Essential (primary) hypertension: I10

## 2019-06-06 HISTORY — DX: Adverse effect of antineoplastic and immunosuppressive drugs, initial encounter: G62.0

## 2019-06-06 LAB — COMPREHENSIVE METABOLIC PANEL
ALT: 8 U/L (ref 0–44)
AST: 27 U/L (ref 15–41)
Albumin: 2.3 g/dL — ABNORMAL LOW (ref 3.5–5.0)
Alkaline Phosphatase: 131 U/L — ABNORMAL HIGH (ref 38–126)
Anion gap: 24 — ABNORMAL HIGH (ref 5–15)
BUN: 49 mg/dL — ABNORMAL HIGH (ref 8–23)
CO2: 15 mmol/L — ABNORMAL LOW (ref 22–32)
Calcium: 9.3 mg/dL (ref 8.9–10.3)
Chloride: 93 mmol/L — ABNORMAL LOW (ref 98–111)
Creatinine, Ser: 1.64 mg/dL — ABNORMAL HIGH (ref 0.44–1.00)
GFR calc Af Amer: 35 mL/min — ABNORMAL LOW (ref >60)
GFR calc non Af Amer: 30 mL/min — ABNORMAL LOW (ref >60)
Glucose, Bld: 216 mg/dL — ABNORMAL HIGH (ref 70–99)
Potassium: 4.9 mmol/L (ref 3.5–5.1)
Sodium: 132 mmol/L — ABNORMAL LOW (ref 135–145)
Total Bilirubin: 1.1 mg/dL (ref 0.3–1.2)
Total Protein: 5.7 g/dL — ABNORMAL LOW (ref 6.5–8.1)

## 2019-06-06 LAB — CBC WITH DIFFERENTIAL/PLATELET
Abs Immature Granulocytes: 0.25 10*3/uL — ABNORMAL HIGH (ref 0.00–0.07)
Basophils Absolute: 0 10*3/uL (ref 0.0–0.1)
Basophils Relative: 0 %
Eosinophils Absolute: 0 10*3/uL (ref 0.0–0.5)
Eosinophils Relative: 0 %
HCT: 23.3 % — ABNORMAL LOW (ref 36.0–46.0)
Hemoglobin: 7.1 g/dL — ABNORMAL LOW (ref 12.0–15.0)
Immature Granulocytes: 1 %
Lymphocytes Relative: 3 %
Lymphs Abs: 0.6 10*3/uL — ABNORMAL LOW (ref 0.7–4.0)
MCH: 23.9 pg — ABNORMAL LOW (ref 26.0–34.0)
MCHC: 30.5 g/dL (ref 30.0–36.0)
MCV: 78.5 fL — ABNORMAL LOW (ref 80.0–100.0)
Monocytes Absolute: 1 10*3/uL (ref 0.1–1.0)
Monocytes Relative: 4 %
Neutro Abs: 21.1 10*3/uL — ABNORMAL HIGH (ref 1.7–7.7)
Neutrophils Relative %: 92 %
Platelets: 531 10*3/uL — ABNORMAL HIGH (ref 150–400)
RBC: 2.97 MIL/uL — ABNORMAL LOW (ref 3.87–5.11)
RDW: 18.6 % — ABNORMAL HIGH (ref 11.5–15.5)
WBC: 22.9 10*3/uL — ABNORMAL HIGH (ref 4.0–10.5)
nRBC: 0 % (ref 0.0–0.2)

## 2019-06-06 LAB — URINALYSIS, COMPLETE (UACMP) WITH MICROSCOPIC
Bilirubin Urine: NEGATIVE
Glucose, UA: NEGATIVE mg/dL
Ketones, ur: NEGATIVE mg/dL
Nitrite: NEGATIVE
Protein, ur: 100 mg/dL — AB
RBC / HPF: >50 RBC/hpf — ABNORMAL HIGH (ref 0–5)
Specific Gravity, Urine: 1.018 (ref 1.005–1.030)
Squamous Epithelial / HPF: NONE SEEN (ref 0–5)
WBC, UA: >50 WBC/hpf — ABNORMAL HIGH (ref 0–5)
pH: 7 (ref 5.0–8.0)

## 2019-06-06 LAB — LACTIC ACID, PLASMA
Lactic Acid, Venous: 8.3 mmol/L (ref 0.5–1.9)
Lactic Acid, Venous: 8.3 mmol/L (ref 0.5–1.9)
Lactic Acid, Venous: 3.4 mmol/L (ref 0.5–1.9)

## 2019-06-06 LAB — MAGNESIUM: Magnesium: 2.1 mg/dL (ref 1.7–2.4)

## 2019-06-06 LAB — PROCALCITONIN: Procalcitonin: 8.2 ng/mL

## 2019-06-06 LAB — SARS CORONAVIRUS 2 BY RT PCR (HOSPITAL ORDER, PERFORMED IN ~~LOC~~ HOSPITAL LAB): SARS Coronavirus 2: NEGATIVE

## 2019-06-06 MED ORDER — HEPARIN SODIUM (PORCINE) 5000 UNIT/ML IJ SOLN: 5000.0000 [IU] | Freq: Three times a day (TID) | INTRAMUSCULAR | Status: DC

## 2019-06-06 MED ORDER — SENNOSIDES-DOCUSATE SODIUM 8.6-50 MG PO TABS: 1.0000 | ORAL_TABLET | Freq: Every evening | ORAL | Status: DC | PRN

## 2019-06-06 MED ORDER — BISACODYL 5 MG PO TBEC: 5.0000 mg | DELAYED_RELEASE_TABLET | Freq: Every day | ORAL | Status: DC | PRN

## 2019-06-06 MED ORDER — ACETAMINOPHEN 325 MG PO TABS: 650.0000 mg | ORAL_TABLET | Freq: Four times a day (QID) | ORAL | Status: DC | PRN

## 2019-06-06 MED ORDER — ALBUTEROL SULFATE (2.5 MG/3ML) 0.083% IN NEBU: 2.5000 mg | INHALATION_SOLUTION | RESPIRATORY_TRACT | Status: DC | PRN

## 2019-06-06 MED ORDER — SODIUM CHLORIDE 0.9 % IV SOLN
2.0000 g | Freq: Once | INTRAVENOUS | Status: AC
  Administered 2019-06-06: 14:00:00 2 g via INTRAVENOUS
  Filled 2019-06-06: qty 2

## 2019-06-06 MED ORDER — VANCOMYCIN HCL IN DEXTROSE 750-5 MG/150ML-% IV SOLN
750.0000 mg | INTRAVENOUS | Status: DC
  Filled 2019-06-06: qty 150

## 2019-06-06 MED ORDER — MORPHINE SULFATE (PF) 2 MG/ML IV SOLN
2.0000 mg | INTRAVENOUS | Status: DC | PRN
  Administered 2019-06-06 (×2): 2 mg via INTRAVENOUS
  Filled 2019-06-06 (×2): qty 1

## 2019-06-06 MED ORDER — ONDANSETRON HCL 4 MG/2ML IJ SOLN
4.0000 mg | Freq: Four times a day (QID) | INTRAMUSCULAR | Status: DC | PRN
  Administered 2019-06-07: 4 mg via INTRAVENOUS
  Filled 2019-06-06: qty 2

## 2019-06-06 MED ORDER — SODIUM CHLORIDE 0.9 % IV BOLUS
1000.0000 mL | Freq: Once | INTRAVENOUS | Status: AC
  Administered 2019-06-06: 14:00:00 1000 mL via INTRAVENOUS

## 2019-06-06 MED ORDER — ONDANSETRON HCL 4 MG/2ML IJ SOLN
4.0000 mg | Freq: Once | INTRAMUSCULAR | Status: AC
  Administered 2019-06-06: 4 mg via INTRAVENOUS
  Filled 2019-06-06: qty 2

## 2019-06-06 MED ORDER — ONDANSETRON HCL 4 MG PO TABS: 4.0000 mg | ORAL_TABLET | Freq: Four times a day (QID) | ORAL | Status: DC | PRN

## 2019-06-06 MED ORDER — ACETAMINOPHEN 650 MG RE SUPP: 650.0000 mg | Freq: Four times a day (QID) | RECTAL | Status: DC | PRN

## 2019-06-06 MED ORDER — HYDROCODONE-ACETAMINOPHEN 5-325 MG PO TABS: 1.0000 | ORAL_TABLET | ORAL | Status: DC | PRN

## 2019-06-06 MED ORDER — SODIUM CHLORIDE 0.9 % IV SOLN
INTRAVENOUS | Status: DC
  Administered 2019-06-06 – 2019-06-08 (×4): via INTRAVENOUS

## 2019-06-06 MED ORDER — SODIUM CHLORIDE 0.9 % IV SOLN
2.0000 g | INTRAVENOUS | Status: DC
  Administered 2019-06-07: 2 g via INTRAVENOUS
  Filled 2019-06-06 (×2): qty 2

## 2019-06-06 MED ORDER — GUAIFENESIN 100 MG/5ML PO SOLN: 5.0000 mL | ORAL | Status: DC | PRN

## 2019-06-06 MED ORDER — VANCOMYCIN HCL IN DEXTROSE 1-5 GM/200ML-% IV SOLN
1000.0000 mg | Freq: Once | INTRAVENOUS | Status: AC
  Administered 2019-06-06: 1000 mg via INTRAVENOUS
  Filled 2019-06-06: qty 200

## 2019-06-06 MED ORDER — SODIUM CHLORIDE 0.9 % IV SOLN
Freq: Once | INTRAVENOUS | Status: AC
  Administered 2019-06-06: 20:00:00 via INTRAVENOUS

## 2019-06-06 NOTE — Consult Note (Signed)
Pharmacy Antibiotic Note  Shari Turner is a 78 y.o. female admitted on 06/07/2019 with pneumonia.  Pharmacy has been consulted for vancomycin and cefepime dosing. She has a history of cancer on hospice  Was recently placed on Cipro for possible pneumonia and/or UTI.  Her SCr is elevated above her baseline. She received 1000 mg IV vancomycin and 2 grams IV cefepime in the ED.  Plan: 1) continue vancomycin 750 mg IV Q 48 hrs Goal AUC 400-550. Expected AUC: 521 SCr used: 1.64 (baseline 1.1) T1/2: 31.5h Css min: 12.5  2) continue cefepime 2 grams IV every 24 hours  Height: 5\' 2"  (157.5 cm) Weight: 100 lb (45.4 kg) IBW/kg (Calculated) : 50.1  No data recorded.  Recent Labs  Lab 05/19/2019 1228  WBC 22.9*  CREATININE 1.64*  LATICACIDVEN 8.3*    Estimated Creatinine Clearance: 21.2 mL/min (A) (by C-G formula based on SCr of 1.64 mg/dL (H)).    Not on File  Antimicrobials this admission: vancomycin 8/28 >>  cefepime 8/28 >>   Microbiology results: 8/28 BCx: pending 8/28 SARS-CoV-2: pending  8/28 MRSA PCR: pending  Thank you for allowing pharmacy to be a part of this patient's care.  Dallie Piles, PharmD 06/02/2019 3:02 PM

## 2019-06-06 NOTE — Progress Notes (Signed)
Pt is hospice care.  MD informed family that pt may "expire at any time."  Discussion of comfort care.  AC and charge nurse aware that MEWS vitals are not being initiated at this time. VS and LOC unchanged for almost 12 hours    Vital Signs MEWS/VS Documentation       05/24/2019 2030 05/17/2019 2045 05/23/2019 2100 05/22/2019 2115   MEWS Score:  4  5  4  4    MEWS Score Color:  Red  Red  Red  Red   Resp:  (!) 23  (!) 29  (!) 21  (!) 22   Pulse:  (!) 109  (!) 109  (!) 107  (!) 108   BP:  107/65  --  104/66  --         05/18/2019 2135  MEWS Assessment  Is this an acute change? No (MD aware.AC aware.  Abx and fluids. VS unchanged for almost 12 hours)

## 2019-06-06 NOTE — ED Notes (Signed)
While turning pt for rectal temp a pressure ulcer was observed on the sacrum

## 2019-06-06 NOTE — Consult Note (Signed)
PHARMACY -  BRIEF ANTIBIOTIC NOTE   Pharmacy has received consult(s) for vancomycin and cefepime from an ED provider.  The patient's profile has been reviewed for ht/wt/allergies/indication/available labs.    One time order(s) placed for 1000 mg vancomycin and 2 grams cefepime  Further antibiotics/pharmacy consults should be ordered by admitting physician if indicated.                       Thank you, Dallie Piles, PharmD 06/02/2019  1:52 PM

## 2019-06-06 NOTE — ED Notes (Signed)
10.3 mg/dL   Total Protein 5.7 (L) 6.5 - 8.1 g/dL   Albumin 2.3 (L) 3.5 - 5.0 g/dL   AST 27 15 - 41 U/L   ALT 8 0 - 44 U/L   Alkaline Phosphatase 131 (H) 38 - 126 U/L   Total Bilirubin 1.1 0.3 - 1.2 mg/dL   GFR calc non Af Amer 30 (L) >60 mL/min   GFR calc Af Amer 35 (L) >60 mL/min   Anion gap 24 (H) 5 - 15    Comment: Performed at [facility], [address] [place], [place] [address]  CBC WITH DIFFERENTIAL     Status: Abnormal   Collection Time: 05/29/2019 12:28 PM  Result Value Ref Range   WBC 22.9 (H) 4.0 - 10.5 K/uL   RBC 2.97 (L) 3.87 - 5.11 MIL/uL   Hemoglobin 7.1 (L) 12.0 - 15.0 g/dL   HCT 23.3 (L) 36.0 - 46.0 %   MCV 78.5 (L) 80.0 - 100.0 fL   MCH 23.9 (L) 26.0 - 34.0 pg   MCHC 30.5 30.0 - 36.0 g/dL   RDW 18.6 (H) 11.5 - 15.5 %   Platelets 531 (H) 150 - 400 K/uL    nRBC 0.0 0.0 - 0.2 %   Neutrophils Relative % 92 %   Neutro Abs 21.1 (H) 1.7 - 7.7 K/uL   Lymphocytes Relative 3 %   Lymphs Abs 0.6 (L) 0.7 - 4.0 K/uL   Monocytes Relative 4 %   Monocytes Absolute 1.0 0.1 - 1.0 K/uL   Eosinophils Relative 0 %   Eosinophils Absolute 0.0 0.0 - 0.5 K/uL   Basophils Relative 0 %   Basophils Absolute 0.0 0.0 - 0.1 K/uL   Immature Granulocytes 1 %   Abs Immature Granulocytes 0.25 (H) 0.00 - 0.07 K/uL    Comment: Performed at [facility], [address] [place], [place] [address]  Procalcitonin     Status: None   Collection Time: 05/31/2019 12:28 PM  Result Value Ref Range   Procalcitonin 8.20 ng/mL    Comment:        Interpretation: PCT > 2 ng/mL: Systemic infection (sepsis) is likely, unless other causes are known. (NOTE)       Sepsis PCT Algorithm           Lower Respiratory Tract                                      Infection PCT Algorithm    ----------------------------     ----------------------------         PCT < 0.25 ng/mL                PCT < 0.10 ng/mL         Strongly encourage             Strongly discourage   discontinuation of antibiotics    initiation of antibiotics    ----------------------------     -----------------------------       PCT 0.25 - 0.50 ng/mL            PCT 0.10 - 0.25 ng/mL               OR       >80% decrease in PCT            Discourage initiation of  10.3 mg/dL   Total Protein 5.7 (L) 6.5 - 8.1 g/dL   Albumin 2.3 (L) 3.5 - 5.0 g/dL   AST 27 15 - 41 U/L   ALT 8 0 - 44 U/L   Alkaline Phosphatase 131 (H) 38 - 126 U/L   Total Bilirubin 1.1 0.3 - 1.2 mg/dL   GFR calc non Af Amer 30 (L) >60 mL/min   GFR calc Af Amer 35 (L) >60 mL/min   Anion gap 24 (H) 5 - 15    Comment: Performed at [facility], [address] [place], [place] [address]  CBC WITH DIFFERENTIAL     Status: Abnormal   Collection Time: 05/29/2019 12:28 PM  Result Value Ref Range   WBC 22.9 (H) 4.0 - 10.5 K/uL   RBC 2.97 (L) 3.87 - 5.11 MIL/uL   Hemoglobin 7.1 (L) 12.0 - 15.0 g/dL   HCT 23.3 (L) 36.0 - 46.0 %   MCV 78.5 (L) 80.0 - 100.0 fL   MCH 23.9 (L) 26.0 - 34.0 pg   MCHC 30.5 30.0 - 36.0 g/dL   RDW 18.6 (H) 11.5 - 15.5 %   Platelets 531 (H) 150 - 400 K/uL    nRBC 0.0 0.0 - 0.2 %   Neutrophils Relative % 92 %   Neutro Abs 21.1 (H) 1.7 - 7.7 K/uL   Lymphocytes Relative 3 %   Lymphs Abs 0.6 (L) 0.7 - 4.0 K/uL   Monocytes Relative 4 %   Monocytes Absolute 1.0 0.1 - 1.0 K/uL   Eosinophils Relative 0 %   Eosinophils Absolute 0.0 0.0 - 0.5 K/uL   Basophils Relative 0 %   Basophils Absolute 0.0 0.0 - 0.1 K/uL   Immature Granulocytes 1 %   Abs Immature Granulocytes 0.25 (H) 0.00 - 0.07 K/uL    Comment: Performed at [facility], [address] [place], [place] [address]  Procalcitonin     Status: None   Collection Time: 05/31/2019 12:28 PM  Result Value Ref Range   Procalcitonin 8.20 ng/mL    Comment:        Interpretation: PCT > 2 ng/mL: Systemic infection (sepsis) is likely, unless other causes are known. (NOTE)       Sepsis PCT Algorithm           Lower Respiratory Tract                                      Infection PCT Algorithm    ----------------------------     ----------------------------         PCT < 0.25 ng/mL                PCT < 0.10 ng/mL         Strongly encourage             Strongly discourage   discontinuation of antibiotics    initiation of antibiotics    ----------------------------     -----------------------------       PCT 0.25 - 0.50 ng/mL            PCT 0.10 - 0.25 ng/mL               OR       >80% decrease in PCT            Discourage initiation of  10.3 mg/dL   Total Protein 5.7 (L) 6.5 - 8.1 g/dL   Albumin 2.3 (L) 3.5 - 5.0 g/dL   AST 27 15 - 41 U/L   ALT 8 0 - 44 U/L   Alkaline Phosphatase 131 (H) 38 - 126 U/L   Total Bilirubin 1.1 0.3 - 1.2 mg/dL   GFR calc non Af Amer 30 (L) >60 mL/min   GFR calc Af Amer 35 (L) >60 mL/min   Anion gap 24 (H) 5 - 15    Comment: Performed at [facility], [address] [place], [place] [address]  CBC WITH DIFFERENTIAL     Status: Abnormal   Collection Time: 05/29/2019 12:28 PM  Result Value Ref Range   WBC 22.9 (H) 4.0 - 10.5 K/uL   RBC 2.97 (L) 3.87 - 5.11 MIL/uL   Hemoglobin 7.1 (L) 12.0 - 15.0 g/dL   HCT 23.3 (L) 36.0 - 46.0 %   MCV 78.5 (L) 80.0 - 100.0 fL   MCH 23.9 (L) 26.0 - 34.0 pg   MCHC 30.5 30.0 - 36.0 g/dL   RDW 18.6 (H) 11.5 - 15.5 %   Platelets 531 (H) 150 - 400 K/uL    nRBC 0.0 0.0 - 0.2 %   Neutrophils Relative % 92 %   Neutro Abs 21.1 (H) 1.7 - 7.7 K/uL   Lymphocytes Relative 3 %   Lymphs Abs 0.6 (L) 0.7 - 4.0 K/uL   Monocytes Relative 4 %   Monocytes Absolute 1.0 0.1 - 1.0 K/uL   Eosinophils Relative 0 %   Eosinophils Absolute 0.0 0.0 - 0.5 K/uL   Basophils Relative 0 %   Basophils Absolute 0.0 0.0 - 0.1 K/uL   Immature Granulocytes 1 %   Abs Immature Granulocytes 0.25 (H) 0.00 - 0.07 K/uL    Comment: Performed at [facility], [address] [place], [place] [address]  Procalcitonin     Status: None   Collection Time: 05/31/2019 12:28 PM  Result Value Ref Range   Procalcitonin 8.20 ng/mL    Comment:        Interpretation: PCT > 2 ng/mL: Systemic infection (sepsis) is likely, unless other causes are known. (NOTE)       Sepsis PCT Algorithm           Lower Respiratory Tract                                      Infection PCT Algorithm    ----------------------------     ----------------------------         PCT < 0.25 ng/mL                PCT < 0.10 ng/mL         Strongly encourage             Strongly discourage   discontinuation of antibiotics    initiation of antibiotics    ----------------------------     -----------------------------       PCT 0.25 - 0.50 ng/mL            PCT 0.10 - 0.25 ng/mL               OR       >80% decrease in PCT            Discourage initiation of  06/09/2019 1330 05/29/2019 1400 06/04/2019 1415 06/05/2019 1430  BP: 110/66 107/65    Pulse: (!) 116     Resp: (!) 27 (!) 42 (!) 36 (!) 28  SpO2: 100%     Weight:      Height:        Isolation  Precautions Airborne and Contact precautions  Medications Medications  0.9 %  sodium chloride infusion (has no administration in time range)  vancomycin (VANCOCIN) IVPB 1000 mg/200 mL premix (1,000 mg Intravenous New Bag/Given 05/16/2019 1428)  morphine 2 MG/ML injection 2 mg (2 mg Intravenous Given 06/07/2019 1437)  sodium chloride 0.9 % bolus 1,000 mL (0 mLs Intravenous Stopped 05/23/2019 1436)  ceFEPIme (MAXIPIME) 2 g in sodium chloride 0.9 % 100 mL IVPB (0 g Intravenous Stopped 06/07/2019 1436)  ondansetron (ZOFRAN) injection 4 mg (4 mg Intravenous Given 05/16/2019 1409)    Mobility non-ambulatory High fall risk   Focused Assessments 1   R Recommendations: See Admitting Provider Note  Report given to:   Additional Notes:  10.3 mg/dL   Total Protein 5.7 (L) 6.5 - 8.1 g/dL   Albumin 2.3 (L) 3.5 - 5.0 g/dL   AST 27 15 - 41 U/L   ALT 8 0 - 44 U/L   Alkaline Phosphatase 131 (H) 38 - 126 U/L   Total Bilirubin 1.1 0.3 - 1.2 mg/dL   GFR calc non Af Amer 30 (L) >60 mL/min   GFR calc Af Amer 35 (L) >60 mL/min   Anion gap 24 (H) 5 - 15    Comment: Performed at [facility], [address] [place], [place] [address]  CBC WITH DIFFERENTIAL     Status: Abnormal   Collection Time: 05/29/2019 12:28 PM  Result Value Ref Range   WBC 22.9 (H) 4.0 - 10.5 K/uL   RBC 2.97 (L) 3.87 - 5.11 MIL/uL   Hemoglobin 7.1 (L) 12.0 - 15.0 g/dL   HCT 23.3 (L) 36.0 - 46.0 %   MCV 78.5 (L) 80.0 - 100.0 fL   MCH 23.9 (L) 26.0 - 34.0 pg   MCHC 30.5 30.0 - 36.0 g/dL   RDW 18.6 (H) 11.5 - 15.5 %   Platelets 531 (H) 150 - 400 K/uL    nRBC 0.0 0.0 - 0.2 %   Neutrophils Relative % 92 %   Neutro Abs 21.1 (H) 1.7 - 7.7 K/uL   Lymphocytes Relative 3 %   Lymphs Abs 0.6 (L) 0.7 - 4.0 K/uL   Monocytes Relative 4 %   Monocytes Absolute 1.0 0.1 - 1.0 K/uL   Eosinophils Relative 0 %   Eosinophils Absolute 0.0 0.0 - 0.5 K/uL   Basophils Relative 0 %   Basophils Absolute 0.0 0.0 - 0.1 K/uL   Immature Granulocytes 1 %   Abs Immature Granulocytes 0.25 (H) 0.00 - 0.07 K/uL    Comment: Performed at [facility], [address] [place], [place] [address]  Procalcitonin     Status: None   Collection Time: 05/31/2019 12:28 PM  Result Value Ref Range   Procalcitonin 8.20 ng/mL    Comment:        Interpretation: PCT > 2 ng/mL: Systemic infection (sepsis) is likely, unless other causes are known. (NOTE)       Sepsis PCT Algorithm           Lower Respiratory Tract                                      Infection PCT Algorithm    ----------------------------     ----------------------------         PCT < 0.25 ng/mL                PCT < 0.10 ng/mL         Strongly encourage             Strongly discourage   discontinuation of antibiotics    initiation of antibiotics    ----------------------------     -----------------------------       PCT 0.25 - 0.50 ng/mL            PCT 0.10 - 0.25 ng/mL               OR       >80% decrease in PCT            Discourage initiation of

## 2019-06-06 NOTE — ED Notes (Signed)
EKG completed

## 2019-06-06 NOTE — ED Triage Notes (Signed)
Pt arrives via [facility] for suspected sepsis, pt is on hospice and is a DNR. Family reports she was dx with pneumonia on Saturday and has a decline in mentation since then. Pt wears O2 3L at home. Pt started vomiting on Monday and is not eating or drinking. Pt only responds to pain.

## 2019-06-06 NOTE — ED Notes (Signed)
Unable to get access to get second set of blood cultures before starting abx, Dr. Quentin Cornwall informed

## 2019-06-06 NOTE — ED Notes (Signed)
Attempted to give report to 1C RN, she states she will check with the charge RN and call me back

## 2019-06-06 NOTE — Progress Notes (Signed)
Advanced Care Plan.  Purpose of Encounter: Comfort care. Parties in Attendance: The patient, her daughter and me. Patient's Decisional Capacity: No. Medical Story: Shari Turner  is a 78 y.o. female with a known history of multiple medical problems as below.  The patient has a history of endometrial cancer, on hospice care at home.    She is being admitted for acute respiratory failure with hypoxia due to sepsis and pneumonia, lactic acidosis and acute renal failure.  I discussed with the patient's daughters about her current condition, very poor prognosis, hospice care and comfort care.  The patient may expire anytime. The daughter will discuss with other family members to decide comfort care. Goals of Care Determinations: Comfort care. Plan:  Code Status: DNR. Time spent discussing advance care planning: 18 minutes.

## 2019-06-06 NOTE — H&P (Addendum)
[facility] NAME: Smita Haugh    MR#:  [id number]  DATE OF BIRTH:  [birth year]  DATE OF ADMISSION:  05/12/2019  PRIMARY CARE PHYSICIAN: System, Pcp Not In   REQUESTING/REFERRING PHYSICIAN: Dr. Quentin Cornwall.  CHIEF COMPLAINT:   Chief Complaint  Patient presents with   Code Sepsis   Generalized weakness and declining condition. HISTORY OF PRESENT ILLNESS:  Jasneet Mckiernan  is a 78 y.o. female with a known history of multiple medical problems as below.  The patient has a history of endometrial cancer, on hospice care at home.  She has had generalized poor oral intake and weakness.  She was recently treated with Cipro for possible pneumonia or UTI.  Per her daughter, the patient's condition has been declining.  She is brought to ED for further evaluation.  She is found hypoxia, put on oxygen by nasal cannula 2.5 L.  She also has leukocytosis with WBC 22.9, lactic acidosis at 8.3, procalcitonin 8.2, chest x-ray report infiltrate.  She also has acute renal failure.  The patient is in hospice care at home but her two daughters want antibiotics and supportive care at this time. PAST MEDICAL HISTORY:   Past Medical History:  Diagnosis Date   Anemia    Chemotherapy-induced neuropathy (HCC)    CHF (congestive heart failure) (HCC)    Chronic systolic CHF (congestive heart failure) ([place])    Diabetes mellitus without complication ([place])    Endometrial cancer ([place])    Failure to thrive in adult    HTN (hypertension)     PAST SURGICAL HISTORY:  Mastectomy.  Cholecystectomy  SOCIAL HISTORY:   Social History   Tobacco Use   Smoking status: Not on file  Substance Use Topics   Alcohol use: Not on file    FAMILY HISTORY:   Family History  Problem Relation Age of Onset   Hypertension Mother    Hypertension Father    Heart disease Father     DRUG ALLERGIES:  Not on File  REVIEW OF SYSTEMS:   Review of Systems    Unable to perform ROS: Medical condition    MEDICATIONS AT HOME:   Prior to Admission medications   Medication Sig Start Date End Date Taking? Authorizing Provider  apixaban (ELIQUIS) 2.5 MG TABS tablet Take 2.5 mg by mouth 2 (two) times daily.   Yes [provider]  cholecalciferol (VITAMIN D3) 25 MCG (1000 UT) tablet Take 1,000 Units by mouth daily.   Yes [provider]  ciprofloxacin (CIPRO) 500 MG tablet Take 500 mg by mouth 2 (two) times daily.   Yes [provider]  ferrous sulfate 325 (65 FE) MG tablet Take 325 mg by mouth daily with breakfast.   Yes [provider]  furosemide (LASIX) 40 MG tablet Take 40 mg by mouth daily.   Yes [provider]  glimepiride (AMARYL) 2 MG tablet Take 2 mg by mouth daily with breakfast.   Yes [provider]  LORazepam (ATIVAN) 0.5 MG tablet TAKE 1 TABLET BY MOUTH EVERY 4 HOURS AS NEEDED FOR ANXIETY/AGITATION. 03/09/19  Yes [provider]  Magnesium 250 MG TABS Take 250 mg by mouth daily.   Yes [provider]  morphine (MS CONTIN) 15 MG 12 hr tablet Take 15 mg by mouth every 12 (twelve) hours.   Yes [provider]  oxyCODONE (OXY IR/ROXICODONE) 5 MG immediate release tablet Take 5-10 mg by mouth every 4 (four) hours as  needed for severe pain.   Yes [provider]  sennosides-docusate sodium (SENOKOT-S) 8.6-50 MG tablet Take 1 tablet by mouth 2 (two) times daily.   Yes [provider]  Trospium Chloride 60 MG CP24 Take 60 mg by mouth daily.   Yes [provider]      VITAL SIGNS:  Blood pressure 107/65, pulse (!) 116, resp. rate (!) 28, height 5\' 2"  (1.575 m), weight 45.4 kg, SpO2 100 %.  PHYSICAL EXAMINATION:  Physical Exam  GENERAL:  78 y.o.-year-old patient lying in the bed with no acute distress.  EYES: Pupils equal, round, reactive to light and accommodation. No scleral icterus. Extraocular muscles intact.  HEENT: Head atraumatic,  normocephalic. NECK:  Supple, no jugular venous distention. No thyroid enlargement, no tenderness.  LUNGS: Normal breath sounds bilaterally, no wheezing, rales,rhonchi or crepitation. No use of accessory muscles of respiration.  CARDIOVASCULAR: S1, S2 normal. No murmurs, rubs, or gallops.  ABDOMEN: Soft, nontender, nondistended. Bowel sounds present. No organomegaly or mass.  EXTREMITIES: No pedal edema, cyanosis, or clubbing.  NEUROLOGIC: Unable to exam. PSYCHIATRIC: The patient is confused, drowsy and lethargic. SKIN: No obvious rash, lesion, or ulcer.   LABORATORY PANEL:   CBC Recent Labs  Lab 05/27/2019 1228  WBC 22.9*  HGB 7.1*  HCT 23.3*  PLT 531*   ------------------------------------------------------------------------------------------------------------------  Chemistries  Recent Labs  Lab 05/14/2019 1228  NA 132*  K 4.9  CL 93*  CO2 15*  GLUCOSE 216*  BUN 49*  CREATININE 1.64*  CALCIUM 9.3  AST 27  ALT 8  ALKPHOS 131*  BILITOT 1.1   ------------------------------------------------------------------------------------------------------------------  Cardiac Enzymes No results for input(s): TROPONINI in the last 168 hours. ------------------------------------------------------------------------------------------------------------------  RADIOLOGY:  Dg Chest Port 1 View  Result Date: 05/18/2019 CLINICAL DATA:  Pneumonia, sepsis. EXAM: PORTABLE CHEST 1 VIEW COMPARISON:  Radiographs of July 27, 2017. FINDINGS: Stable cardiomegaly. Atherosclerosis of thoracic aorta is noted. Mild right pleural effusion is noted with associated right basilar atelectasis or infiltrate. Mild left basilar subsegmental atelectasis is noted. Bony thorax is unremarkable. IMPRESSION: Mild right basilar atelectasis or infiltrate is noted with associated small right pleural effusion. Mild left basilar subsegmental atelectasis is noted. Aortic Atherosclerosis (ICD10-I70.0). Electronically  Signed   By: Marijo Conception M.D.   On: 06/05/2019 13:21      IMPRESSION AND PLAN:   Acute respiratory failure with hypoxia due to sepsis secondary to UTI and pneumonia. The patient will be admitted to medical floor. Continue oxygen by nasal cannula, continue antibiotics, Robitussin as needed.  Follow-up CBC and cultures.  Lactic acidosis.  Treatment as above, IV fluid support and follow-up lactic acid level. Acute renal failure.  IV fluid support and follow-up BMP  Anemia of chronic disease.  Follow-up hemoglobin.  The patient is seen hospice care at home.  Family want antibiotics and IV fluid treatment. I discussed with the patient's 2 daughters about the patient's very poor prognosis.  The patient may get worse and die any time.  All the records are reviewed and case discussed with ED provider. Management plans discussed with the patient, her 2 daughters and they are in agreement.  CODE STATUS: DNR.  TOTAL TIME TAKING CARE OF THIS PATIENT: 52 minutes.    Demetrios Loll M.D on 06/03/2019 at 2:39 PM  Between 7am to 6pm - Pager - [phone]  After 6pm go to [email or web address] - Proofreader  [facility]  Office  [phone]  CC: Primary care physician; System, Pcp Not  In   Note: This dictation was prepared with Dragon dictation along with smaller phrase technology. Any transcriptional errors that result from this process are unin

## 2019-06-06 NOTE — ED Provider Notes (Signed)
[facility] Emergency Department Provider Note    First MD Initiated Contact with Patient 06/03/2019 1242     (approximate)  I have reviewed the triage vital signs and the nursing notes.   HISTORY  Chief Complaint Code Sepsis  Level V Caveat: ams sepsis  HPI Shari Turner is a 78 y.o. female history of cancer on hospice presents to the ER for evaluation of significant decline over the past few days.  She is not eating or drinking.  She is having fevers.  Was recently placed on Cipro for possible pneumonia and/or UTI.  Patient has been having her care managed at home but given worsening condition after evaluation by hospice nurse was directed to the ER.    History reviewed. No pertinent past medical history. History reviewed. No pertinent family history.  There are no active problems to display for this patient.     Prior to Admission medications   Not on File    Allergies Patient has no allergy information on record.    Social History Social History   Tobacco Use  . Smoking status: Not on file  Substance Use Topics  . Alcohol use: Not on file  . Drug use: Not on file    Review of Systems Patient denies headaches, rhinorrhea, blurry vision, numbness, shortness of breath, chest pain, edema, cough, abdominal pain, nausea, vomiting, diarrhea, dysuria, fevers, rashes or hallucinations unless otherwise stated above in HPI. ____________________________________________   PHYSICAL EXAM:  VITAL SIGNS: Vitals:   05/18/2019 1315 05/12/2019 1330  BP:  110/66  Pulse: (!) 113 (!) 116  Resp: (!) 27 (!) 27  SpO2: 100% 100%    Constitutional: Alert, frail and ill-appearing. Eyes: Conjunctivae are normal.  Head: Atraumatic. Nose: No congestion/rhinnorhea. Mouth/Throat: Mucous membranes are moist.   Neck: No stridor. Painless ROM.  Cardiovascular: mildly tachycardic, regular rhythm. Grossly normal heart sounds.  Good peripheral circulation.  Respiratory: mild tachypnea with diffuse rhonchorus breath sounds. Gastrointestinal: Soft, No distention. No abdominal bruits. No CVA tenderness. Genitourinary: deferred Musculoskeletal: No lower extremity tenderness nor edema.  No joint effusions. Neurologic:  Normal speech and language. No gross focal neurologic deficits are appreciated. No facial droop Skin:  Skin is warm, dry and intact. No rash noted. Psychiatric: Mood and affect are normal. Speech and behavior are normal.  ____________________________________________   LABS (all labs ordered are listed, but only abnormal results are displayed)  Results for orders placed or performed during the hospital encounter of 06/07/2019 (from the past 24 hour(s))  Lactic acid, plasma     Status: Abnormal   Collection Time: 05/15/2019 12:28 PM  Result Value Ref Range   Lactic Acid, Venous 8.3 (HH) 0.5 - 1.9 mmol/L  Comprehensive metabolic panel     Status: Abnormal   Collection Time: 06/03/2019 12:28 PM  Result Value Ref Range   Sodium 132 (L) 135 - 145 mmol/L   Potassium 4.9 3.5 - 5.1 mmol/L   Chloride 93 (L) 98 - 111 mmol/L   CO2 15 (L) 22 - 32 mmol/L   Glucose, Bld 216 (H) 70 - 99 mg/dL   BUN 49 (H) 8 - 23 mg/dL   Creatinine, Ser 1.64 (H) 0.44 - 1.00 mg/dL   Calcium 9.3 8.9 - 10.3 mg/dL   Total Protein 5.7 (L) 6.5 - 8.1 g/dL   Albumin 2.3 (L) 3.5 - 5.0 g/dL   AST 27 15 - 41 U/L   ALT 8 0 - 44 U/L   Alkaline Phosphatase 131 (H)  38 - 126 U/L   Total Bilirubin 1.1 0.3 - 1.2 mg/dL   GFR calc non Af Amer 30 (L) >60 mL/min   GFR calc Af Amer 35 (L) >60 mL/min   Anion gap 24 (H) 5 - 15  CBC WITH DIFFERENTIAL     Status: Abnormal   Collection Time: 05/30/2019 12:28 PM  Result Value Ref Range   WBC 22.9 (H) 4.0 - 10.5 K/uL   RBC 2.97 (L) 3.87 - 5.11 MIL/uL   Hemoglobin 7.1 (L) 12.0 - 15.0 g/dL   HCT 23.3 (L) 36.0 - 46.0 %   MCV 78.5 (L) 80.0 - 100.0 fL   MCH 23.9 (L) 26.0 - 34.0 pg   MCHC 30.5 30.0 - 36.0 g/dL   RDW 18.6 (H) 11.5 - 15.5  %   Platelets 531 (H) 150 - 400 K/uL   nRBC 0.0 0.0 - 0.2 %   Neutrophils Relative % 92 %   Neutro Abs 21.1 (H) 1.7 - 7.7 K/uL   Lymphocytes Relative 3 %   Lymphs Abs 0.6 (L) 0.7 - 4.0 K/uL   Monocytes Relative 4 %   Monocytes Absolute 1.0 0.1 - 1.0 K/uL   Eosinophils Relative 0 %   Eosinophils Absolute 0.0 0.0 - 0.5 K/uL   Basophils Relative 0 %   Basophils Absolute 0.0 0.0 - 0.1 K/uL   Immature Granulocytes 1 %   Abs Immature Granulocytes 0.25 (H) 0.00 - 0.07 K/uL  Procalcitonin     Status: None   Collection Time: 05/11/2019 12:28 PM  Result Value Ref Range   Procalcitonin 8.20 ng/mL  Urinalysis, Complete w Microscopic     Status: Abnormal   Collection Time: 05/27/2019 12:28 PM  Result Value Ref Range   Color, Urine YELLOW (A) YELLOW   APPearance TURBID (A) CLEAR   Specific Gravity, Urine 1.018 1.005 - 1.030   pH 7.0 5.0 - 8.0   Glucose, UA NEGATIVE NEGATIVE mg/dL   Hgb urine dipstick SMALL (A) NEGATIVE   Bilirubin Urine NEGATIVE NEGATIVE   Ketones, ur NEGATIVE NEGATIVE mg/dL   Protein, ur 100 (A) NEGATIVE mg/dL   Nitrite NEGATIVE NEGATIVE   Leukocytes,Ua SMALL (A) NEGATIVE   RBC / HPF >50 (H) 0 - 5 RBC/hpf   WBC, UA >50 (H) 0 - 5 WBC/hpf   Bacteria, UA MANY (A) NONE SEEN   Squamous Epithelial / LPF NONE SEEN 0 - 5   WBC Clumps PRESENT    Budding Yeast PRESENT    Non Squamous Epithelial PRESENT (A) NONE SEEN   ____________________________________________  EKG My review and personal interpretation at Time: 12:50   Indication: ams  Rate: 115  Rhythm: sinus Axis: normal Other: normal intervals, lch, nonspecific st abn ____________________________________________  RADIOLOGY  I personally reviewed all radiographic images ordered to evaluate for the above acute complaints and reviewed radiology reports and findings.  These findings were personally discussed with the patient.  Please see medical record for radiology report.  ____________________________________________    PROCEDURES  Procedure(s) performed:  .Critical Care Performed by: Merlyn Lot, MD Authorized by: Merlyn Lot, MD   Critical care provider statement:    Critical care time (minutes):  30   Critical care time was exclusive of:  Separately billable procedures and treating other patients   Critical care was necessary to treat or prevent imminent or life-threatening deterioration of the following conditions:  Sepsis   Critical care was time spent personally by me on the following activities:  Development of treatment plan with  patient or surrogate, discussions with consultants, evaluation of patient's response to treatment, examination of patient, obtaining history from patient or surrogate, ordering and performing treatments and interventions, ordering and review of laboratory studies, ordering and review of radiographic studies, pulse oximetry, re-evaluation of patient's condition and review of old charts      Critical Care performed: yes ____________________________________________   INITIAL IMPRESSION / ASSESSMENT AND PLAN / ED COURSE  Pertinent labs & imaging results that were available during my care of the patient were reviewed by me and considered in my medical decision making (see chart for details).   DDX: Dehydration, sepsis, pna, uti, hypoglycemia, cva, drug effect, withdrawal, encephalitis   Shari Turner is a 78 y.o. who presents to the ED with evidence of sepsis and multisystem organ failure with significant elevated lactate.  Tachycardic and hypoxic.  Patient very ill-appearing.  She is under hospice care.  Patient be given broad-spectrum antibiotics and IV fluids according to the family's wishes with goals for treatment but no escalation of care.  Very poor prognosis given her age frailty factors and severe sepsis.  Will discuss with hospitalist for admission.     The patient was evaluated in Emergency Department today for the symptoms described in the history  of present illness. He/she was evaluated in the context of the global COVID-19 pandemic, which necessitated consideration that the patient might be at risk for infection with the SARS-CoV-2 virus that causes COVID-19. Institutional protocols and algorithms that pertain to the evaluation of patients at risk for COVID-19 are in a state of rapid change based on information released by regulatory bodies including the CDC and federal and state organizations. These policies and algorithms were followed during the patient's care in the ED.  As part of my medical decision making, I reviewed the following data within the [facility] notes reviewed and incorporated, Labs reviewed, notes from prior ED visits and [place] Controlled Substance Database   ____________________________________________   FINAL CLINICAL IMPRESSION(S) / ED DIAGNOSES  Final diagnoses:  Sepsis, due to unspecified organism, unspecified whether acute organ dysfunction present [facility])      NEW MEDICATIONS STARTED DURING THIS VISIT:  New Prescriptions   No medications on file     Note:  This document was prepared using Dragon voice recognition software and may include unintentional dictation errors.    Merlyn Lot, MD 06/02/2019 1430

## 2019-06-06 NOTE — ED Notes (Signed)
Dr Robinson at bedside 

## 2019-06-06 NOTE — ED Notes (Signed)
Dr. Quentin Cornwall informed of critical lactic

## 2019-06-06 NOTE — ED Notes (Signed)
Attempted to call report to the floor, was told they would have to check with their [facility] and call me back

## 2019-06-06 NOTE — ED Notes (Signed)
Report given to Sylvia RN

## 2019-06-06 NOTE — ED Notes (Addendum)
At approximately 0610 pts family member came out of room to the nurses station to request an update and stated that nurse from pts home hospice was on the phone requesting to speak to either the EDP or nurse as why the admitting MD said that  pt would not be admitted to the floor.   This RN went into pts room, explained to family that the admitting MD had misunderstood. Pt will be admitted to the floor, however with her MEWS score being so elevated prior, she was not appropriate to be admitted to a regular med-surg floor because she would require higher level of care than they were able to provide. RN explained to family that the MEWS score is used to determine the level of care the pt needs and to determine the possibility of deterioration, explained to family that this is determined by vital signs and lab results.    Family was informed that her MEWS score is now lower and that she is currently assigned to be admitted to the floor and that we would hopefully be getting her moved to the floor within the next hour. Pt family verbalized understanding and denies any further needs at this time.

## 2019-06-06 NOTE — ED Notes (Signed)
Attempted for 2nd set of cultures x2.

## 2019-06-06 NOTE — ED Notes (Signed)
First set of cultures obtained from R wrist. Sent to lab.

## 2019-06-07 ENCOUNTER — Other Ambulatory Visit: Payer: Self-pay

## 2019-06-07 ENCOUNTER — Inpatient Hospital Stay: Payer: Medicare Other

## 2019-06-07 DIAGNOSIS — L8990 Pressure ulcer of unspecified site, unspecified stage: Secondary | ICD-10-CM | POA: Insufficient documentation

## 2019-06-07 LAB — BASIC METABOLIC PANEL
Anion gap: 15 (ref 5–15)
BUN: 61 mg/dL — ABNORMAL HIGH (ref 8–23)
CO2: 21 mmol/L — ABNORMAL LOW (ref 22–32)
Calcium: 8.7 mg/dL — ABNORMAL LOW (ref 8.9–10.3)
Chloride: 98 mmol/L (ref 98–111)
Creatinine, Ser: 1.7 mg/dL — ABNORMAL HIGH (ref 0.44–1.00)
GFR calc Af Amer: 34 mL/min — ABNORMAL LOW (ref >60)
GFR calc non Af Amer: 29 mL/min — ABNORMAL LOW (ref >60)
Glucose, Bld: 267 mg/dL — ABNORMAL HIGH (ref 70–99)
Potassium: 4.4 mmol/L (ref 3.5–5.1)
Sodium: 134 mmol/L — ABNORMAL LOW (ref 135–145)

## 2019-06-07 LAB — CBC
HCT: 22 % — ABNORMAL LOW (ref 36.0–46.0)
Hemoglobin: 6.8 g/dL — ABNORMAL LOW (ref 12.0–15.0)
MCH: 24.4 pg — ABNORMAL LOW (ref 26.0–34.0)
MCHC: 30.9 g/dL (ref 30.0–36.0)
MCV: 78.9 fL — ABNORMAL LOW (ref 80.0–100.0)
Platelets: 391 10*3/uL (ref 150–400)
RBC: 2.79 MIL/uL — ABNORMAL LOW (ref 3.87–5.11)
RDW: 18.9 % — ABNORMAL HIGH (ref 11.5–15.5)
WBC: 27 10*3/uL — ABNORMAL HIGH (ref 4.0–10.5)
nRBC: 0 % (ref 0.0–0.2)

## 2019-06-07 LAB — LACTIC ACID, PLASMA: Lactic Acid, Venous: 3.9 mmol/L (ref 0.5–1.9)

## 2019-06-07 LAB — MRSA PCR SCREENING: MRSA by PCR: NEGATIVE

## 2019-06-07 NOTE — Progress Notes (Signed)
[place] at [place] NAME: Shari Turner    MR#:  [id number]  DATE OF BIRTH:  1941/06/24  SUBJECTIVE:  CHIEF COMPLAINT:   Chief Complaint  Patient presents with  . Code Sepsis  Obtunded, daughter at bedside REVIEW OF SYSTEMS:  ROS: Unable to obtain due to her mental status DRUG ALLERGIES:  Not on File VITALS:  Blood pressure 99/61, pulse (!) 108, temperature 97.6 F (36.4 C), resp. rate 16, height 5\' 2"  (1.575 m), weight 45.4 kg, SpO2 100 %. PHYSICAL EXAMINATION:  Physical Exam Constitutional:      Appearance: She is cachectic. She is ill-appearing and toxic-appearing.  HENT:     Head: Normocephalic and atraumatic.  Eyes:     Conjunctiva/sclera: Conjunctivae normal.     Pupils: Pupils are equal, round, and reactive to light.  Neck:     Musculoskeletal: Normal range of motion and neck supple.     Thyroid: No thyromegaly.     Trachea: No tracheal deviation.  Cardiovascular:     Rate and Rhythm: Normal rate and regular rhythm.     Heart sounds: Normal heart sounds.  Pulmonary:     Effort: Pulmonary effort is normal. No respiratory distress.     Breath sounds: Normal breath sounds. No wheezing.  Chest:     Chest wall: No tenderness.  Abdominal:     General: Bowel sounds are normal. There is no distension.     Palpations: Abdomen is soft.     Tenderness: There is no abdominal tenderness.  Musculoskeletal: Normal range of motion.  Skin:    General: Skin is warm and dry.     Findings: No rash.  Neurological:     Mental Status: She is lethargic.     Cranial Nerves: No cranial nerve deficit.  Psychiatric:        Behavior: Behavior is uncooperative.     Comments: Unable to evaluate due to her mental status    LABORATORY PANEL:  Female CBC Recent Labs  Lab 06/07/19 0642  WBC 27.0*  HGB 6.8*  HCT 22.0*  PLT 391    ------------------------------------------------------------------------------------------------------------------ Chemistries  Recent Labs  Lab 06/09/2019 1228 06/07/19 0642  NA 132* 134*  K 4.9 4.4  CL 93* 98  CO2 15* 21*  GLUCOSE 216* 267*  BUN 49* 61*  CREATININE 1.64* 1.70*  CALCIUM 9.3 8.7*  MG 2.1  --   AST 27  --   ALT 8  --   ALKPHOS 131*  --   BILITOT 1.1  --    RADIOLOGY:  Dg Chest 1 View  Result Date: 06/07/2019 CLINICAL DATA:  CHF. Sepsis. Hypertension. Endometrial cancer. Diabetes. CHF. EXAM: CHEST  1 VIEW COMPARISON:  05/27/2019 FINDINGS: Moderate cardiomegaly. Atherosclerosis in the transverse aorta. Small right pleural effusion is similar. No pneumothorax. Interstitial and airspace disease is greater on the right than left with relative sparing of the left upper lung. IMPRESSION: No significant change since one day prior. Right greater than left interstitial and airspace process with right pleural effusion. At least a portion of this is felt to represent asymmetric pulmonary edema. Pneumonia, especially at the right lung base, cannot be excluded. Electronically Signed   By: Abigail Miyamoto M.D.   On: 06/07/2019 10:49   Dg Chest Port 1 View  Result Date: 05/25/2019 CLINICAL DATA:  Pneumonia, sepsis. EXAM: PORTABLE CHEST 1 VIEW COMPARISON:  Radiographs of July 27, 2017. FINDINGS: Stable cardiomegaly.  Atherosclerosis of thoracic aorta is noted. Mild right pleural effusion is noted with associated right basilar atelectasis or infiltrate. Mild left basilar subsegmental atelectasis is noted. Bony thorax is unremarkable. IMPRESSION: Mild right basilar atelectasis or infiltrate is noted with associated small right pleural effusion. Mild left basilar subsegmental atelectasis is noted. Aortic Atherosclerosis (ICD10-I70.0). Electronically Signed   By: Marijo Conception M.D.   On: 05/30/2019 13:21   ASSESSMENT AND PLAN:  78 year old female being admitted for acute respiratory failure   * Acute respiratory failure with hypoxia due to sepsis secondary to UTI and pneumonia. -Family would like to give a trial of antibiotics and fluid for 24 hours - Continue oxygen by nasal cannula, continue antibiotics, Robitussin as needed.  *Hematemesis -Could have underlying GI erosion/ulcer -Family does not want any aggressive care, patient under hospice care and they would like comfort care -Stop heparin subcu which was ordered for DVT prophylaxis -I have also discussed with family about Eliquis and I would stop it at this time.  Is not patient has not taken Eliquis since last Saturday  * Lactic acidosis.  Treatment as above, IV fluid support   * Acute renal failure.  IV fluid support and follow-up BMP  *Acute blood loss anemia with underlying anemia of chronic disease  -Likely from underlying GI erosion/ulcer -Stop all blood thinners -We will hold off GI consultation as patient/family would like conservative measure and comfort care -She is followed by hospice at home and they would like to continue same at discharge  *Type 2 diabetes mellitus: Comfort care   All the records are reviewed and case discussed with Care Management/Social Worker. Management plans discussed with the patient, family (daughter at bedside) and they are in agreement.  CODE STATUS: DNR  TOTAL TIME TAKING CARE OF THIS PATIENT: 35 minutes.   More than 50% of the time was spent in counseling/coordination of care: YES  POSSIBLE D/C IN 1-2 DAYS, DEPENDING ON CLINICAL CONDITION.   Max Sane M.D on 06/07/2019 at 11:10 AM  Between 7am to 6pm - Pager - [phone]  After 6pm go to [email or web address] - Proofreader  [facility]  Office  [phone]  CC: Primary care physician; System, Pcp Not In  Note: This dictation was prepared with Dragon dictation along with smaller phrase technology. Any transcriptional errors that result from this process are unintentional.

## 2019-06-07 NOTE — Progress Notes (Signed)
Earlier today,while bathing patient and patient was on her side, pt vomited large amount of black coffee ground emesis x2. Md made aware,daughter present at the time. Shari Turner.RN

## 2019-06-07 NOTE — Progress Notes (Signed)
Family Meeting Note  Advance Directive:yes  Today a meeting took place with the Patient and Daughter at bedside.  Patient is unable to participate due [id number] capacity Her mental status   The following clinical team members were present during this meeting:MD  The following were discussed:Patient's diagnosis: 78 year old female admitted for acute respiratory failure and acute blood loss anemia  Past Medical History:  Diagnosis Date  . Anemia   . Chemotherapy-induced neuropathy ([place])   . CHF (congestive heart failure) ([place])   . Chronic systolic CHF (congestive heart failure) ([place])   . Diabetes mellitus without complication ([place])   . Endometrial cancer ([place])   . Failure to thrive in adult   . HTN (hypertension)      Patient's progosis: < 6 months and Goals for treatment: DNR  Additional follow-up to be provided: Hospice at home  Time spent during discussion:20 minutes  Max Sane, MD

## 2019-06-07 NOTE — Progress Notes (Signed)
   06/07/19 0800  Clinical Encounter Type  Visited With Patient and family together  Visit Type Initial;Spiritual support  Referral From Nurse  Spiritual Encounters  Spiritual Needs Prayer;Emotional;Grief support  Stress Factors  Patient Stress Factors Exhausted;Loss  Family Stress Factors Exhausted  Advance Directives (For Healthcare)  Does Patient Have a Medical Advance Directive? Yes  Does patient want to make changes to medical advance directive? No - Patient declined

## 2019-06-07 NOTE — Plan of Care (Signed)
  Problem: Respiratory: Goal: Ability to maintain adequate ventilation will improve Outcome: Progressing   Problem: Health Behavior/Discharge Planning: Goal: Ability to manage health-related needs will improve Outcome: Progressing   Problem: Clinical Measurements: Goal: Respiratory complications will improve Outcome: Progressing   Problem: Activity: Goal: Risk for activity intolerance will decrease Outcome: Progressing   Problem: Nutrition: Goal: Adequate nutrition will be maintained Outcome: Progressing   Problem: Coping: Goal: Level of anxiety will decrease Outcome: Progressing   Problem: Elimination: Goal: Will not experience complications related to urinary retention Outcome: Progressing   Problem: Pain Managment: Goal: General experience of comfort will improve Outcome: Progressing   Problem: Safety: Goal: Ability to remain free from injury will improve Outcome: Progressing

## 2019-06-08 MED ORDER — SODIUM CHLORIDE 0.9 % IV SOLN
500.0000 mg | INTRAVENOUS | Status: DC
  Filled 2019-06-08 (×2): qty 500

## 2019-06-08 MED ORDER — GLYCOPYRROLATE 0.2 MG/ML IJ SOLN: 0.2000 mg | INTRAMUSCULAR | Status: DC | PRN

## 2019-06-08 MED ORDER — GLYCOPYRROLATE 1 MG PO TABS
1.0000 mg | ORAL_TABLET | ORAL | Status: DC | PRN
  Filled 2019-06-08: qty 1

## 2019-06-08 MED ORDER — POLYVINYL ALCOHOL 1.4 % OP SOLN
1.0000 [drp] | Freq: Four times a day (QID) | OPHTHALMIC | Status: DC | PRN
  Filled 2019-06-08: qty 15

## 2019-06-08 MED ORDER — SODIUM CHLORIDE 0.9 % IV SOLN
2.0000 g | INTRAVENOUS | Status: DC
  Administered 2019-06-08: 13:00:00 2 g via INTRAVENOUS
  Filled 2019-06-08: qty 20
  Filled 2019-06-08: qty 2

## 2019-06-08 MED ORDER — DIPHENHYDRAMINE HCL 50 MG/ML IJ SOLN: 25.0000 mg | INTRAMUSCULAR | Status: DC | PRN

## 2019-06-10 LAB — URINE CULTURE: Culture: >=100000 — AB

## 2019-06-10 NOTE — Progress Notes (Addendum)
[place] at [place] NAME: Shari Turner    MR#:  [id number]  DATE OF BIRTH:  July 01, 1941  SUBJECTIVE:  CHIEF COMPLAINT:   Chief Complaint  Patient presents with  . Code Sepsis  Obtunded, son at bedside REVIEW OF SYSTEMS:  ROS: Unable to obtain due to her mental status DRUG ALLERGIES:  Not on File VITALS:  Blood pressure (!) 93/58, pulse (!) 112, temperature 99.7 F (37.6 C), temperature source Oral, resp. rate 16, height 5\' 2"  (1.575 m), weight 45.4 kg, SpO2 100 %. PHYSICAL EXAMINATION:  Physical Exam Constitutional:      Appearance: She is cachectic. She is ill-appearing and toxic-appearing.  HENT:     Head: Normocephalic and atraumatic.  Eyes:     Conjunctiva/sclera: Conjunctivae normal.     Pupils: Pupils are equal, round, and reactive to light.  Neck:     Musculoskeletal: Normal range of motion and neck supple.     Thyroid: No thyromegaly.     Trachea: No tracheal deviation.  Cardiovascular:     Rate and Rhythm: Normal rate and regular rhythm.     Heart sounds: Normal heart sounds.  Pulmonary:     Effort: Pulmonary effort is normal. No respiratory distress.     Breath sounds: Normal breath sounds. No wheezing.  Chest:     Chest wall: No tenderness.  Abdominal:     General: Bowel sounds are normal. There is no distension.     Palpations: Abdomen is soft.     Tenderness: There is no abdominal tenderness.  Musculoskeletal: Normal range of motion.  Skin:    General: Skin is warm and dry.     Findings: No rash.  Neurological:     Mental Status: She is lethargic.     Cranial Nerves: No cranial nerve deficit.  Psychiatric:        Behavior: Behavior is uncooperative.     Comments: Unable to evaluate due to her mental status    LABORATORY PANEL:  Female CBC Recent Labs  Lab 06/07/19 0642  WBC 27.0*  HGB 6.8*  HCT 22.0*  PLT 391    ------------------------------------------------------------------------------------------------------------------ Chemistries  Recent Labs  Lab 06/05/2019 1228 06/07/19 0642  NA 132* 134*  K 4.9 4.4  CL 93* 98  CO2 15* 21*  GLUCOSE 216* 267*  BUN 49* 61*  CREATININE 1.64* 1.70*  CALCIUM 9.3 8.7*  MG 2.1  --   AST 27  --   ALT 8  --   ALKPHOS 131*  --   BILITOT 1.1  --    RADIOLOGY:  No results found. ASSESSMENT AND PLAN:  78 year old female being admitted for acute respiratory failure  * Acute respiratory failure with hypoxia due to sepsis secondary to UTI and pneumonia. -Family would like to give a trial of antibiotics and fluid for 1 more night - Continue oxygen by nasal cannula, continue antibiotics, Robitussin as needed.  *Hematemesis -Could have underlying GI erosion/ulcer -Family does not want any aggressive care, patient under hospice care at home and they would like to continue comfort care - recommend stopping eliquis at home  * Lactic acidosis  * Acute renal failure  *Acute blood loss anemia with underlying anemia of chronic disease  -Likely from underlying GI erosion/ulcer -Stop all blood thinners -We will hold off GI consultation as patient/family would like conservative measure and comfort care -She is followed by hospice at home and they would  like to continue same at discharge  *Type 2 diabetes mellitus: Comfort care   Please note patient is looking critically sick and family has chosen comfort care which is very appropriate.  Patient is actively dying and may pass while here   All the records are reviewed and case discussed with Care Management/Social Worker. Management plans discussed with the patient, family (son at bedside) and they are in agreement.  CODE STATUS: DNR  TOTAL TIME TAKING CARE OF THIS PATIENT: 35 minutes.   More than 50% of the time was spent in counseling/coordination of care: YES  POSSIBLE D/C IN 1 DAYS, DEPENDING ON  CLINICAL CONDITION.   Max Sane M.D on 24-Jun-2019 at 9:34 AM  Between 7am to 6pm - Pager - [phone]  After 6pm go to [email or web address] - Proofreader  [facility]  Office  [phone]  CC: Primary care physician; System, Pcp Not In  Note: This dictation was prepared with Dragon dictation along with smaller phrase technology. Any transcriptional errors that result from this process are unintentional.

## 2019-06-10 NOTE — Death Summary Note (Signed)
DEATH SUMMARY   Patient Details  Name: Shari Turner MRN: [id number] DOB: 1941-06-30  Admission/Discharge Information   Admit Date:  06-09-2019  Date of Death: Date of Death: Jun 11, 2019  Time of Death: Time of Death: 08/31/1408  Length of Stay: 2  Referring Physician: System, Pcp Not In   Reason(s) for Hospitalization  sepsis Diagnoses  Preliminary cause of death: acute on chronic blood loss anemia Secondary Diagnoses (including complications and co-morbidities):  Active Problems:   Sepsis (HCC)   Pressure injury of skin  Past Medical History:  Diagnosis Date  . Anemia   . Chemotherapy-induced neuropathy (HCC)   . CHF (congestive heart failure) (HCC)   . Chronic systolic CHF (congestive heart failure) (HCC)   . Diabetes mellitus without complication (HCC)   . Endometrial cancer (HCC)   . Failure to thrive in adult   . HTN (hypertension)     Brief Hospital Course (including significant findings, care, treatment, and services provided and events leading to death)  Shari Turner is a 78 y.o. year old female admitted for acute respiratory failure  * Acute respiratory failure with hypoxia due to sepsis secondary to UTI and pneumonia. *Hematemesis *Acute blood loss anemia with underlying anemia of chronic disease  * Lactic acidosis * Acute renal failure *Type 2 diabetes mellitus  Patient was under Hospice care at home but family brought in to provider short course of Abx and IVFs. Patient was actively dying and after d/w family. She was placed on comfort care. Pertinent Labs and Studies  Significant Diagnostic Studies Dg Chest 1 View  Result Date: 06/07/2019 CLINICAL DATA:  CHF. Sepsis. Hypertension. Endometrial cancer. Diabetes. CHF. EXAM: CHEST  1 VIEW COMPARISON:  06-09-2019 FINDINGS: Moderate cardiomegaly. Atherosclerosis in the transverse aorta. Small right pleural effusion is similar. No pneumothorax. Interstitial and airspace disease is greater on the right than left with  relative sparing of the left upper lung. IMPRESSION: No significant change since one day prior. Right greater than left interstitial and airspace process with right pleural effusion. At least a portion of this is felt to represent asymmetric pulmonary edema. Pneumonia, especially at the right lung base, cannot be excluded. Electronically Signed   By: Jeronimo Greaves M.D.   On: 06/07/2019 10:49   Dg Chest Port 1 View  Result Date: 06-09-19 CLINICAL DATA:  Pneumonia, sepsis. EXAM: PORTABLE CHEST 1 VIEW COMPARISON:  Radiographs of July 27, 2017. FINDINGS: Stable cardiomegaly. Atherosclerosis of thoracic aorta is noted. Mild right pleural effusion is noted with associated right basilar atelectasis or infiltrate. Mild left basilar subsegmental atelectasis is noted. Bony thorax is unremarkable. IMPRESSION: Mild right basilar atelectasis or infiltrate is noted with associated small right pleural effusion. Mild left basilar subsegmental atelectasis is noted. Aortic Atherosclerosis (ICD10-I70.0). Electronically Signed   By: Lupita Raider M.D.   On: 06-09-19 13:21    Microbiology Recent Results (from the past 240 hour(s))  Urine culture     Status: Abnormal (Preliminary result)   Collection Time: 09-Jun-2019 12:28 PM   Specimen: In/Out Cath Urine  Result Value Ref Range Status   Specimen Description   Final    IN/OUT CATH URINE Performed at [facility], [address], [place], [place] [address]    Special Requests   Final    NONE Performed at [facility], [address] [place], [place] [address]    Culture (A)  Final    >=100,000 COLONIES/mL ESCHERICHIA COLI CULTURE REINCUBATED FOR BETTER GROWTH Performed at [facility],  [address], [place], [place] [address]    Report Status PENDING  Incomplete   Organism ID, Bacteria ESCHERICHIA COLI (A)  Final      Susceptibility   Escherichia coli - MIC*    AMPICILLIN 16 INTERMEDIATE Intermediate     CEFAZOLIN <=4  SENSITIVE Sensitive     CEFTRIAXONE <=1 SENSITIVE Sensitive     CIPROFLOXACIN >=4 RESISTANT Resistant     GENTAMICIN <=1 SENSITIVE Sensitive     IMIPENEM <=0.25 SENSITIVE Sensitive     NITROFURANTOIN <=16 SENSITIVE Sensitive     TRIMETH/SULFA <=20 SENSITIVE Sensitive     AMPICILLIN/SULBACTAM 4 SENSITIVE Sensitive     PIP/TAZO <=4 SENSITIVE Sensitive     Extended ESBL NEGATIVE Sensitive     * >=100,000 COLONIES/mL ESCHERICHIA COLI  Blood Culture (routine x 2)     Status: None (Preliminary result)   Collection Time: 05/25/2019 12:44 PM   Specimen: BLOOD  Result Value Ref Range Status   Specimen Description BLOOD BLOOD RIGHT WRIST  Final   Special Requests   Final    BOTTLES DRAWN AEROBIC AND ANAEROBIC Blood Culture adequate volume   Culture   Final    NO GROWTH 2 DAYS Performed at [facility], [address], [place], [place] [address]    Report Status PENDING  Incomplete  SARS Coronavirus 2 [facility] order, Performed in [facility] hospital lab) Nasopharyngeal Nasopharyngeal Swab     Status: None   Collection Time: 05/29/2019  2:15 PM   Specimen: Nasopharyngeal Swab  Result Value Ref Range Status   SARS Coronavirus 2 NEGATIVE NEGATIVE Final    Comment: (NOTE) If result is NEGATIVE SARS-CoV-2 target nucleic acids are NOT DETECTED. The SARS-CoV-2 RNA is generally detectable in upper and lower  respiratory specimens during the acute phase of infection. The lowest  concentration of SARS-CoV-2 viral copies this assay can detect is 250  copies / mL. A negative result does not preclude SARS-CoV-2 infection  and should not be used as the sole basis for treatment or other  patient management decisions.  A negative result may occur with  improper specimen collection / handling, submission of specimen other  than nasopharyngeal swab, presence of viral mutation(s) within the  areas targeted by this assay, and inadequate number of viral copies  (<250 copies / mL). A negative result  must be combined with clinical  observations, patient history, and epidemiological information. If result is POSITIVE SARS-CoV-2 target nucleic acids are DETECTED. The SARS-CoV-2 RNA is generally detectable in upper and lower  respiratory specimens dur ing the acute phase of infection.  Positive  results are indicative of active infection with SARS-CoV-2.  Clinical  correlation with patient history and other diagnostic information is  necessary to determine patient infection status.  Positive results do  not rule out bacterial infection or co-infection with other viruses. If result is PRESUMPTIVE POSTIVE SARS-CoV-2 nucleic acids MAY BE PRESENT.   A presumptive positive result was obtained on the submitted specimen  and confirmed on repeat testing.  While 2019 novel coronavirus  (SARS-CoV-2) nucleic acids may be present in the submitted sample  additional confirmatory testing may be necessary for epidemiological  and / or clinical management purposes  to differentiate between  SARS-CoV-2 and other Sarbecovirus currently known to infect humans.  If clinically indicated additional testing with an alternate test  methodology [phone]) is advised. The SARS-CoV-2 RNA is generally  detectable in upper and lower respiratory sp ecimens during the acute  phase of infection.  The expected result is Negative. Fact Sheet for Patients:  BoilerBrush.com.cy Fact Sheet for Healthcare Providers: https://pope.com/ This test is not yet approved or cleared by the [place] FDA and has been authorized for detection and/or diagnosis of SARS-CoV-2 by FDA under an Emergency Use Authorization (EUA).  This EUA will remain in effect (meaning this test can be used) for the duration of the COVID-19 declaration under Section 564(b)(1) of the Act, 21 U.S.C. section 360bbb-3(b)(1), unless the authorization is terminated or revoked sooner. Performed at [facility], [address] [place], [place] [address]   MRSA PCR Screening     Status: None   Collection Time: 06/07/19  5:24 AM   Specimen: Nasal Mucosa; Nasopharyngeal  Result Value Ref Range Status   MRSA by PCR NEGATIVE NEGATIVE Final    Comment:        The GeneXpert MRSA Assay (FDA approved for NASAL specimens only), is one component of a comprehensive MRSA colonization surveillance program. It is not intended to diagnose MRSA infection nor to guide or monitor treatment for MRSA infections. Performed at [facility], [address] [place], [place] [address]   Blood Culture (routine x 2)     Status: None (Preliminary result)   Collection Time: 06/07/19  6:42 AM   Specimen: BLOOD  Result Value Ref Range Status   Specimen Description BLOOD BLOOD LEFT HAND  Final   Special Requests   Final    BOTTLES DRAWN AEROBIC AND ANAEROBIC Blood Culture results may not be optimal due to an inadequate volume of blood received in culture bottles   Culture   Final    NO GROWTH < 24 HOURS Performed at [facility], [address] [place], [place] [address]    Report Status PENDING  Incomplete    Lab Basic Metabolic Panel: Recent Labs  Lab 2019-06-12 1228 06/07/19 0642  NA 132* 134*  K 4.9 4.4  CL 93* 98  CO2 15* 21*  GLUCOSE 216* 267*  BUN 49* 61*  CREATININE 1.64* 1.70*  CALCIUM 9.3 8.7*  MG 2.1  --    Liver Function Tests: Recent Labs  Lab 06-12-19 1228  AST 27  ALT 8  ALKPHOS 131*  BILITOT 1.1  PROT 5.7*  ALBUMIN 2.3*   No results for input(s): LIPASE, AMYLASE in the last 168 hours. No results for input(s): AMMONIA in the last 168 hours. CBC: Recent Labs  Lab 2019/06/12 1228 06/07/19 0642  WBC 22.9* 27.0*  NEUTROABS 21.1*  --   HGB 7.1* 6.8*  HCT 23.3* 22.0*  MCV 78.5* 78.9*  PLT 531* 391   Cardiac Enzymes: No results for input(s): CKTOTAL, CKMB, CKMBINDEX, TROPONINI in the last 168 hours. Sepsis Labs: Recent Labs  Lab  2019/06/12 1228 Jun 12, 2019 1534 2019-06-12 1958 06/12/2019 2358 06/07/19 0642  PROCALCITON 8.20  --   --   --   --   WBC 22.9*  --   --   --  27.0*  LATICACIDVEN 8.3* 8.3* 3.4* 3.9*  --     Procedures/Operations  none   Donel Osowski Sherryll Burger 06/05/2019, 2:53 PM

## 2019-06-10 NOTE — Progress Notes (Signed)
   06/10/19 1346  Clinical Encounter Type  Visited With Patient and family together  Visit Type Follow-up  Referral From Nurse  Consult/Referral To Chaplain  Spiritual Encounters  Spiritual Needs Prayer;Emotional;Grief support  CH received page at [phone] for patient at end of life.  PT's daughter and several others from support system were present including minister. [place] provided pastoral care by participating in life review and anticipating needs of family. Also facilitated communication between family and staff. Family was grieving appropriately throughout visit. [place] prayed upon request.  Patient was pronounced dead at 32. Pastoral visit was appreciated.

## 2019-06-10 NOTE — Death Summary Note (Signed)
Patient TOD 1409 Confirmed by myself and Geni Bers, RN. Dr., Charge and [facility] notified.  - family and chaplain in the room at TOD.

## 2019-06-10 DEATH — deceased

## 2019-06-11 LAB — CULTURE, BLOOD (ROUTINE X 2)
Culture: NO GROWTH
Special Requests: ADEQUATE

## 2019-06-12 LAB — CULTURE, BLOOD (ROUTINE X 2): Culture: NO GROWTH
